# Patient Record
Sex: Female | Born: 1950 | Race: White | Hispanic: No | Marital: Single | Smoking: Current every day smoker
Health system: Southern US, Community
[De-identification: ages and names within clinical notes are randomized; demographics above are authoritative.]

## PROBLEM LIST (undated history)

## (undated) DIAGNOSIS — F1010 Alcohol abuse, uncomplicated: Secondary | ICD-10-CM

## (undated) DIAGNOSIS — J449 Chronic obstructive pulmonary disease, unspecified: Secondary | ICD-10-CM

## (undated) HISTORY — PX: KNEE SURGERY: SHX244

## (undated) HISTORY — PX: APPENDECTOMY: SHX54

---

## 2016-09-06 ENCOUNTER — Encounter: Payer: Self-pay | Admitting: Emergency Medicine

## 2016-09-06 ENCOUNTER — Emergency Department
Admission: EM | Admit: 2016-09-06 | Discharge: 2016-09-06 | Disposition: A | Discharge status: Home / Self Care | Payer: Self-pay | Source: Home / Self Care | Attending: Family Medicine | Admitting: Family Medicine

## 2016-09-06 DIAGNOSIS — K047 Periapical abscess without sinus: Secondary | ICD-10-CM

## 2016-09-06 DIAGNOSIS — K029 Dental caries, unspecified: Secondary | ICD-10-CM

## 2016-09-06 MED ORDER — HYDROCODONE-ACETAMINOPHEN 5-325 MG PO TABS: 1.0000 | ORAL_TABLET | Freq: Four times a day (QID) | ORAL | 0 refills | Status: DC | PRN

## 2016-09-06 MED ORDER — CLINDAMYCIN HCL 300 MG PO CAPS: ORAL_CAPSULE | ORAL | 0 refills | Status: DC

## 2016-09-06 NOTE — ED Provider Notes (Signed)
[facility]    CSN: [id number] Arrival date & time: 09/06/16  1248     History   Chief Complaint Chief Complaint  Patient presents with  . Dental Pain    HPI Marie Sloan is a 66 y.o. female.   Patient has had a cracked tooth in her left upper jaw for several weeks.  During the past two days she has developed increasing pain in the tooth and swelling/pain in her left cheek.  No fevers, chills, and sweats.  She has a dental appointment in 18 days.     The history is provided by the patient.  Dental Pain  Location:  Upper Upper teeth location:  12/LU 1st bicuspid Quality:  Aching and constant Severity:  Moderate Onset quality:  Sudden Duration:  2 days Timing:  Constant Progression:  Worsening Chronicity:  New Context: dental caries, dental fracture and poor dentition   Relieved by:  Nothing Worsened by:  Touching Ineffective treatments:  None tried Associated symptoms: facial pain, facial swelling, gum swelling and neck swelling   Associated symptoms: no congestion, no difficulty swallowing, no drooling, no fever, no headaches, no neck pain, no oral bleeding, no oral lesions and no trismus   Risk factors: lack of dental care     History reviewed. No pertinent past medical history.  There are no active problems to display for this patient.   Past Surgical History:  Procedure Laterality Date  . APPENDECTOMY    . KNEE SURGERY      OB History    No data available       Home Medications    Prior to Admission medications   Medication Sig Start Date End Date Taking? Authorizing Provider  clindamycin (CLEOCIN) 300 MG capsule Take one cap PO every 8 hours 09/06/16   Lattie HawBeese, Stephen A, MD  HYDROcodone-acetaminophen (NORCO/VICODIN) 5-325 MG tablet Take 1 tablet by mouth every 6 (six) hours as needed for severe pain. 09/06/16   Lattie HawBeese, Stephen A, MD    Family History History reviewed. No pertinent family history.  Social History Social History    Substance Use Topics  . Smoking status: Current Every Day Smoker    Packs/day: 1.00  . Smokeless tobacco: Never Used  . Alcohol use Yes     Allergies   Patient has no known allergies.   Review of Systems Review of Systems  Constitutional: Negative for chills, diaphoresis and fever.  HENT: Positive for facial swelling. Negative for congestion, drooling and mouth sores.   Musculoskeletal: Negative for neck pain.  Neurological: Negative for headaches.  All other systems reviewed and are negative.    Physical Exam Triage Vital Signs ED Triage Vitals  Enc Vitals Group     BP 09/06/16 1318 (!) 159/76     Pulse Rate 09/06/16 1318 89     Resp 09/06/16 1318 16     Temp 09/06/16 1318 98.8 F (37.1 C)     Temp Source 09/06/16 1318 Oral     SpO2 09/06/16 1318 96 %     Weight 09/06/16 1319 107 lb (48.5 kg)     Height 09/06/16 1319 5\' 9"  (1.753 m)     Head Circumference --      Peak Flow --      Pain Score 09/06/16 1319 4     Pain Loc --      Pain Edu? --      Excl. in GC? --    No data found.   Updated Vital Signs  BP (!) 159/76 (BP Location: Left Arm)   Pulse 89   Temp 98.8 F (37.1 C) (Oral)   Resp 16   Ht 5\' 9"  (1.753 m)   Wt 107 lb (48.5 kg)   SpO2 96%   BMI 15.80 kg/m   Visual Acuity Right Eye Distance:   Left Eye Distance:   Bilateral Distance:    Right Eye Near:   Left Eye Near:    Bilateral Near:     Physical Exam  Constitutional: She appears well-developed and well-nourished. No distress.  HENT:  Head: Normocephalic.    Right Ear: External ear normal.  Left Ear: External ear normal.  Nose: Nose normal.  Mouth/Throat: Oropharynx is clear and moist and mucous membranes are normal. No trismus in the jaw. Abnormal dentition. Dental caries present.    Left 12th tooth in left upper jaw is eroded and tender to palpation.  There is tenderness of the adjacent gingiva.  No fluctuance.  There is mild swelling and tenderness to palpation left cheek as  noted on diagram.   Eyes: Conjunctivae are normal. Pupils are equal, round, and reactive to light.  Neck: Neck supple.  Cardiovascular: Normal rate.   Lymphadenopathy:    She has no cervical adenopathy.  Neurological: She is alert.  Skin: Skin is warm and dry.  Nursing note and vitals reviewed.    UC Treatments / Results  Labs (all labs ordered are listed, but only abnormal results are displayed) Labs Reviewed - No data to display  EKG  EKG Interpretation None       Radiology No results found.  Procedures Procedures (including critical care time)  Medications Ordered in UC Medications - No data to display   Initial Impression / Assessment and Plan / UC Course  I have reviewed the triage vital signs and the nursing notes.  Pertinent labs & imaging results that were available during my care of the patient were reviewed by me and considered in my medical decision making (see chart for details).    Begin Clindamycin 300mg  TID #30 Lortab for pain. May take Ibuprofen 200mg , 4 tabs every 8 hours with food for pain. Followup with dentist as soon as possible.     Final Clinical Impressions(s) / UC Diagnoses   Final diagnoses:  Dental caries  Dental infection    New Prescriptions New Prescriptions   CLINDAMYCIN (CLEOCIN) 300 MG CAPSULE    Take one cap PO every 8 hours   HYDROCODONE-ACETAMINOPHEN (NORCO/VICODIN) 5-325 MG TABLET    Take 1 tablet by mouth every 6 (six) hours as needed for severe pain.     Lattie Haw, MD 09/16/16 1121

## 2016-09-06 NOTE — Discharge Instructions (Signed)
May take Ibuprofen 200mg, 4 tabs every 8 hours with food for pain. 

## 2016-09-06 NOTE — ED Triage Notes (Signed)
Patient cracked tooth in upper left area of mouth in past few weeks; there is infection there now and cannot see DDS for 18 more days when her insurance becomes valid. Has taken OTCs today.

## 2017-02-09 DIAGNOSIS — R69 Illness, unspecified: Secondary | ICD-10-CM | POA: Diagnosis not present

## 2017-03-14 ENCOUNTER — Emergency Department
Admission: EM | Admit: 2017-03-14 | Discharge: 2017-03-14 | Disposition: A | Discharge status: Home / Self Care | Payer: Medicare HMO | Source: Home / Self Care | Attending: Family Medicine | Admitting: Family Medicine

## 2017-03-14 ENCOUNTER — Emergency Department (INDEPENDENT_AMBULATORY_CARE_PROVIDER_SITE_OTHER): Payer: Medicare HMO

## 2017-03-14 ENCOUNTER — Encounter: Payer: Self-pay | Admitting: Emergency Medicine

## 2017-03-14 DIAGNOSIS — R911 Solitary pulmonary nodule: Secondary | ICD-10-CM | POA: Diagnosis not present

## 2017-03-14 DIAGNOSIS — J984 Other disorders of lung: Secondary | ICD-10-CM | POA: Diagnosis not present

## 2017-03-14 DIAGNOSIS — J181 Lobar pneumonia, unspecified organism: Secondary | ICD-10-CM | POA: Diagnosis not present

## 2017-03-14 DIAGNOSIS — R079 Chest pain, unspecified: Secondary | ICD-10-CM | POA: Diagnosis not present

## 2017-03-14 DIAGNOSIS — J9601 Acute respiratory failure with hypoxia: Secondary | ICD-10-CM | POA: Diagnosis not present

## 2017-03-14 DIAGNOSIS — E876 Hypokalemia: Secondary | ICD-10-CM | POA: Diagnosis not present

## 2017-03-14 DIAGNOSIS — R0602 Shortness of breath: Secondary | ICD-10-CM

## 2017-03-14 DIAGNOSIS — R509 Fever, unspecified: Secondary | ICD-10-CM | POA: Diagnosis not present

## 2017-03-14 DIAGNOSIS — E860 Dehydration: Secondary | ICD-10-CM | POA: Diagnosis not present

## 2017-03-14 DIAGNOSIS — R05 Cough: Secondary | ICD-10-CM | POA: Diagnosis not present

## 2017-03-14 DIAGNOSIS — J189 Pneumonia, unspecified organism: Secondary | ICD-10-CM

## 2017-03-14 DIAGNOSIS — A403 Sepsis due to Streptococcus pneumoniae: Secondary | ICD-10-CM | POA: Diagnosis not present

## 2017-03-14 DIAGNOSIS — J90 Pleural effusion, not elsewhere classified: Secondary | ICD-10-CM | POA: Diagnosis not present

## 2017-03-14 DIAGNOSIS — R918 Other nonspecific abnormal finding of lung field: Secondary | ICD-10-CM | POA: Diagnosis not present

## 2017-03-14 DIAGNOSIS — R531 Weakness: Secondary | ICD-10-CM | POA: Diagnosis not present

## 2017-03-14 LAB — POCT FASTING CBG KUC MANUAL ENTRY: POCT Glucose (KUC): 100 mg/dL — AB (ref 70–99)

## 2017-03-14 MED ORDER — METHYLPREDNISOLONE SODIUM SUCC 40 MG IJ SOLR
80.0000 mg | Freq: Once | INTRAMUSCULAR | Status: AC
  Administered 2017-03-14: 80 mg via INTRAMUSCULAR

## 2017-03-14 MED ORDER — IPRATROPIUM-ALBUTEROL 0.5-2.5 (3) MG/3ML IN SOLN
3.0000 mL | Freq: Once | RESPIRATORY_TRACT | Status: AC
  Administered 2017-03-14: 3 mL via RESPIRATORY_TRACT

## 2017-03-14 NOTE — Discharge Instructions (Signed)
°  IT IS EXTREMELY IMPORTANT THAT YOU GO DIRECTLY TO THE HOSPITAL FOR FURTHER TREATMENT OF YOUR PNEUMONIA.  THIS COULD LEAD TO DEATH IF NOT TREATED APPROPRIATELY AND PROMPTLY WITH STRONG ANTIBIOTICS.

## 2017-03-14 NOTE — ED Provider Notes (Signed)
[facility]    CSN: [id number] Arrival date & time: 03/14/17  1111     History   Chief Complaint Chief Complaint  Patient presents with  . Cough    HPI Marie Sloan is a 66 y.o. female.   HPI Marie Sloan is a 66 y.o. female presenting to UC with c/o 1 week of gradually worsening sinus congestion, facial pressure, SOB, mild to moderately productive cough, generalized weakness and fatigue.  She has tried OTC cough/cold medications but denies relief.  Denies known fever or sick contacts. Denies hx of pneumonia, asthma, or COPD. She does smoke 1ppd cigarettes. No recent travel. She recently receive her medicare and is in the process of finding a PCP.   History reviewed. No pertinent past medical history.  There are no active problems to display for this patient.   Past Surgical History:  Procedure Laterality Date  . APPENDECTOMY    . KNEE SURGERY      OB History    No data available       Home Medications    Prior to Admission medications   Not on File    Family History No family history on file.  Social History Social History   Tobacco Use  . Smoking status: Current Every Day Smoker    Packs/day: 1.00  . Smokeless tobacco: Never Used  Substance Use Topics  . Alcohol use: Yes    Comment: 12 drinks a week  . Drug use: Not on file     Allergies   Patient has no known allergies.   Review of Systems Review of Systems  Constitutional: Positive for fatigue. Negative for chills and fever.  HENT: Positive for congestion, sinus pressure and sinus pain. Negative for ear pain, sore throat, trouble swallowing and voice change.   Respiratory: Positive for cough, chest tightness and shortness of breath. Negative for wheezing.   Cardiovascular: Negative for chest pain and palpitations.  Gastrointestinal: Negative for abdominal pain, diarrhea, nausea and vomiting.  Musculoskeletal: Negative for arthralgias, back pain and myalgias.  Skin:  Negative for rash.  Neurological: Positive for headaches. Negative for dizziness and light-headedness.     Physical Exam Triage Vital Signs ED Triage Vitals [03/14/17 1134]  Enc Vitals Group     BP 125/77     Pulse Rate (!) 116     Resp      Temp (!) 97.4 F (36.3 C)     Temp Source Oral     SpO2 (!) 86 %     Weight 105 lb (47.6 kg)     Height 5\' 8"  (1.727 m)     Head Circumference      Peak Flow      Pain Score 0     Pain Loc      Pain Edu?      Excl. in GC?    No data found.  Updated Vital Signs BP 125/77   Pulse (!) 116   Temp (!) 97.4 F (36.3 C) (Oral)   Ht 5\' 8"  (1.727 m)   Wt 105 lb (47.6 kg)   SpO2 (!) 86%   BMI 15.97 kg/m   Visual Acuity Right Eye Distance:   Left Eye Distance:   Bilateral Distance:    Right Eye Near:   Left Eye Near:    Bilateral Near:     Physical Exam  Constitutional: She is oriented to person, place, and time. No distress.  Pt sitting in exam chair, appears disheveled and acutely  ill but is alert and cooperative during exam.   HENT:  Head: Normocephalic and atraumatic.  Right Ear: Tympanic membrane normal.  Left Ear: Tympanic membrane normal.  Nose: Nose normal.  Mouth/Throat: Uvula is midline, oropharynx is clear and moist and mucous membranes are normal.  Eyes: EOM are normal.  Neck: Normal range of motion. Neck supple.  Cardiovascular: Regular rhythm. Tachycardia present.  Pulmonary/Chest: Effort normal. No accessory muscle usage or stridor. No respiratory distress. She has decreased breath sounds. She has wheezes. She has rhonchi. She has rales.  Diffuse coarse breath sounds with decreased breath sounds in Left lower lung field.  Musculoskeletal: Normal range of motion.  Neurological: She is alert and oriented to person, place, and time.  Skin: Skin is warm and dry. She is not diaphoretic.  Psychiatric: She has a normal mood and affect. Her behavior is normal.  Nursing note and vitals reviewed.    UC Treatments /  Results  Labs (all labs ordered are listed, but only abnormal results are displayed) Labs Reviewed  POCT FASTING CBG [facility] MANUAL ENTRY - Abnormal; Notable for the following components:      Result Value   POCT Glucose ([facility]) 100 (*)    All other components within normal limits    EKG  EKG Interpretation None       Radiology Dg Chest 2 View  Result Date: 03/14/2017 CLINICAL DATA:  Shortness of breath. Cough. Congestion. Bilateral chest pain. EXAM: CHEST  2 VIEW COMPARISON:  None. FINDINGS: The heart size is normal. Lungs are hyperexpanded. A right upper lobe and left lower lobe pneumonia is are present. Right middle lobe airspace disease is suspected as well. The visualized soft tissues and bony thorax are unremarkable. IMPRESSION: 1. Multi lobar pneumonia with airspace disease most prominent in the right upper and left lower lobes. 2. Additional airspace disease in the right middle lobe. 3. Changes of COPD. 4. Recommend follow-up chest x-ray following therapy to assure clearing of the airspace disease. Electronically Signed   By: Marin Robertshristopher  Mattern M.D.   On: 03/14/2017 12:33    Procedures Procedures (including critical care time)  Medications Ordered in UC Medications  ipratropium-albuterol (DUONEB) 0.5-2.5 (3) MG/3ML nebulizer solution 3 mL (3 mLs Nebulization Given 03/14/17 1153)  methylPREDNISolone sodium succinate (SOLU-MEDROL) 40 mg/mL injection 80 mg (80 mg Intramuscular Given 03/14/17 1153)     Initial Impression / Assessment and Plan / UC Course  I have reviewed the triage vital signs and the nursing notes.  Pertinent labs & imaging results that were available during my care of the patient were reviewed by me and considered in my medical decision making (see chart for details).     Hx and exam c/w hypoxia likely due to multi lobar pneumonia. Strongly advise pt to go to emergency department for further evaluation and treatment, likely admission for treatment of  pneumonia and related symptoms.   Pt declined EMS transport and does not have anyone available to drive her to ED.   Pt is stable however due to low oxygen level 84-88% despite Solumedrol and breathing treatment, concern for pt driving POV.  Pt signed refusal to transport to hospital wavier understanding the risks of driving herself to the hospital.  [facility].  Pt agreeable to drive herself.    Final Clinical Impressions(s) / UC Diagnoses   Final diagnoses:  Pneumonia, unspecified organism  Shortness of breath    ED Discharge Orders  None       Controlled Substance Prescriptions [place] Controlled Substance Registry consulted? Not Applicable   Lurene Shadow, PA-C 03/14/17 1402

## 2017-03-14 NOTE — ED Triage Notes (Signed)
Patient complaining of sinus headache, SOB, cough, weak feeling, tried OTC meds, no relief.

## 2017-03-15 DIAGNOSIS — J9811 Atelectasis: Secondary | ICD-10-CM | POA: Diagnosis not present

## 2017-03-15 DIAGNOSIS — Z885 Allergy status to narcotic agent status: Secondary | ICD-10-CM | POA: Diagnosis not present

## 2017-03-15 DIAGNOSIS — J90 Pleural effusion, not elsewhere classified: Secondary | ICD-10-CM | POA: Diagnosis not present

## 2017-03-15 DIAGNOSIS — R918 Other nonspecific abnormal finding of lung field: Secondary | ICD-10-CM | POA: Diagnosis not present

## 2017-03-15 DIAGNOSIS — J9601 Acute respiratory failure with hypoxia: Secondary | ICD-10-CM | POA: Diagnosis not present

## 2017-03-15 DIAGNOSIS — E860 Dehydration: Secondary | ICD-10-CM | POA: Diagnosis not present

## 2017-03-15 DIAGNOSIS — R0602 Shortness of breath: Secondary | ICD-10-CM | POA: Diagnosis not present

## 2017-03-15 DIAGNOSIS — A403 Sepsis due to Streptococcus pneumoniae: Secondary | ICD-10-CM | POA: Diagnosis not present

## 2017-03-15 DIAGNOSIS — I083 Combined rheumatic disorders of mitral, aortic and tricuspid valves: Secondary | ICD-10-CM | POA: Diagnosis not present

## 2017-03-15 DIAGNOSIS — E876 Hypokalemia: Secondary | ICD-10-CM | POA: Diagnosis not present

## 2017-03-15 DIAGNOSIS — Z720 Tobacco use: Secondary | ICD-10-CM | POA: Diagnosis not present

## 2017-03-15 DIAGNOSIS — R911 Solitary pulmonary nodule: Secondary | ICD-10-CM | POA: Diagnosis not present

## 2017-03-15 DIAGNOSIS — A4189 Other specified sepsis: Secondary | ICD-10-CM | POA: Diagnosis not present

## 2017-03-15 DIAGNOSIS — A419 Sepsis, unspecified organism: Secondary | ICD-10-CM | POA: Diagnosis not present

## 2017-03-15 DIAGNOSIS — R410 Disorientation, unspecified: Secondary | ICD-10-CM | POA: Diagnosis not present

## 2017-03-15 DIAGNOSIS — Z789 Other specified health status: Secondary | ICD-10-CM | POA: Diagnosis not present

## 2017-03-15 DIAGNOSIS — J479 Bronchiectasis, uncomplicated: Secondary | ICD-10-CM | POA: Diagnosis not present

## 2017-03-15 DIAGNOSIS — J440 Chronic obstructive pulmonary disease with acute lower respiratory infection: Secondary | ICD-10-CM | POA: Diagnosis not present

## 2017-03-15 DIAGNOSIS — R69 Illness, unspecified: Secondary | ICD-10-CM | POA: Diagnosis not present

## 2017-03-15 DIAGNOSIS — E871 Hypo-osmolality and hyponatremia: Secondary | ICD-10-CM | POA: Diagnosis not present

## 2017-03-15 DIAGNOSIS — R5383 Other fatigue: Secondary | ICD-10-CM | POA: Diagnosis not present

## 2017-03-15 DIAGNOSIS — R7881 Bacteremia: Secondary | ICD-10-CM | POA: Diagnosis not present

## 2017-03-15 DIAGNOSIS — R531 Weakness: Secondary | ICD-10-CM | POA: Diagnosis not present

## 2017-03-15 DIAGNOSIS — J189 Pneumonia, unspecified organism: Secondary | ICD-10-CM | POA: Diagnosis not present

## 2017-03-15 DIAGNOSIS — J13 Pneumonia due to Streptococcus pneumoniae: Secondary | ICD-10-CM | POA: Diagnosis not present

## 2017-03-15 DIAGNOSIS — J984 Other disorders of lung: Secondary | ICD-10-CM | POA: Diagnosis not present

## 2017-03-15 DIAGNOSIS — D72825 Bandemia: Secondary | ICD-10-CM | POA: Diagnosis not present

## 2017-03-15 DIAGNOSIS — R509 Fever, unspecified: Secondary | ICD-10-CM | POA: Diagnosis not present

## 2017-03-16 DIAGNOSIS — J189 Pneumonia, unspecified organism: Secondary | ICD-10-CM | POA: Diagnosis not present

## 2017-03-16 DIAGNOSIS — J479 Bronchiectasis, uncomplicated: Secondary | ICD-10-CM | POA: Diagnosis not present

## 2017-03-16 DIAGNOSIS — J90 Pleural effusion, not elsewhere classified: Secondary | ICD-10-CM | POA: Diagnosis not present

## 2017-03-16 DIAGNOSIS — E876 Hypokalemia: Secondary | ICD-10-CM | POA: Diagnosis not present

## 2017-03-16 DIAGNOSIS — E871 Hypo-osmolality and hyponatremia: Secondary | ICD-10-CM | POA: Diagnosis not present

## 2017-03-16 DIAGNOSIS — A403 Sepsis due to Streptococcus pneumoniae: Secondary | ICD-10-CM | POA: Diagnosis not present

## 2017-03-16 DIAGNOSIS — R69 Illness, unspecified: Secondary | ICD-10-CM | POA: Diagnosis not present

## 2017-03-16 DIAGNOSIS — R7881 Bacteremia: Secondary | ICD-10-CM | POA: Diagnosis not present

## 2017-03-16 DIAGNOSIS — J9601 Acute respiratory failure with hypoxia: Secondary | ICD-10-CM | POA: Diagnosis not present

## 2017-03-16 DIAGNOSIS — A419 Sepsis, unspecified organism: Secondary | ICD-10-CM | POA: Diagnosis not present

## 2017-03-16 DIAGNOSIS — Z789 Other specified health status: Secondary | ICD-10-CM | POA: Diagnosis not present

## 2017-03-16 DIAGNOSIS — J9811 Atelectasis: Secondary | ICD-10-CM | POA: Diagnosis not present

## 2017-03-16 DIAGNOSIS — J13 Pneumonia due to Streptococcus pneumoniae: Secondary | ICD-10-CM | POA: Diagnosis not present

## 2017-03-17 DIAGNOSIS — J9601 Acute respiratory failure with hypoxia: Secondary | ICD-10-CM | POA: Diagnosis not present

## 2017-03-17 DIAGNOSIS — E871 Hypo-osmolality and hyponatremia: Secondary | ICD-10-CM | POA: Diagnosis not present

## 2017-03-17 DIAGNOSIS — A419 Sepsis, unspecified organism: Secondary | ICD-10-CM | POA: Diagnosis not present

## 2017-03-17 DIAGNOSIS — J13 Pneumonia due to Streptococcus pneumoniae: Secondary | ICD-10-CM | POA: Diagnosis not present

## 2017-03-17 DIAGNOSIS — R7881 Bacteremia: Secondary | ICD-10-CM | POA: Diagnosis not present

## 2017-03-17 DIAGNOSIS — E876 Hypokalemia: Secondary | ICD-10-CM | POA: Diagnosis not present

## 2017-03-17 DIAGNOSIS — Z789 Other specified health status: Secondary | ICD-10-CM | POA: Diagnosis not present

## 2017-03-18 DIAGNOSIS — E871 Hypo-osmolality and hyponatremia: Secondary | ICD-10-CM | POA: Diagnosis not present

## 2017-03-18 DIAGNOSIS — A419 Sepsis, unspecified organism: Secondary | ICD-10-CM | POA: Diagnosis not present

## 2017-03-18 DIAGNOSIS — R7881 Bacteremia: Secondary | ICD-10-CM | POA: Diagnosis not present

## 2017-03-18 DIAGNOSIS — E876 Hypokalemia: Secondary | ICD-10-CM | POA: Diagnosis not present

## 2017-03-18 DIAGNOSIS — Z789 Other specified health status: Secondary | ICD-10-CM | POA: Diagnosis not present

## 2017-03-18 DIAGNOSIS — J13 Pneumonia due to Streptococcus pneumoniae: Secondary | ICD-10-CM | POA: Diagnosis not present

## 2017-03-18 DIAGNOSIS — J9601 Acute respiratory failure with hypoxia: Secondary | ICD-10-CM | POA: Diagnosis not present

## 2017-03-19 DIAGNOSIS — J13 Pneumonia due to Streptococcus pneumoniae: Secondary | ICD-10-CM | POA: Diagnosis not present

## 2017-03-19 DIAGNOSIS — J9601 Acute respiratory failure with hypoxia: Secondary | ICD-10-CM | POA: Diagnosis not present

## 2017-03-19 DIAGNOSIS — Z789 Other specified health status: Secondary | ICD-10-CM | POA: Diagnosis not present

## 2017-03-19 DIAGNOSIS — E871 Hypo-osmolality and hyponatremia: Secondary | ICD-10-CM | POA: Diagnosis not present

## 2017-03-19 DIAGNOSIS — A419 Sepsis, unspecified organism: Secondary | ICD-10-CM | POA: Diagnosis not present

## 2017-03-19 DIAGNOSIS — A403 Sepsis due to Streptococcus pneumoniae: Secondary | ICD-10-CM | POA: Diagnosis not present

## 2017-03-19 DIAGNOSIS — E876 Hypokalemia: Secondary | ICD-10-CM | POA: Diagnosis not present

## 2017-03-19 DIAGNOSIS — R7881 Bacteremia: Secondary | ICD-10-CM | POA: Diagnosis not present

## 2017-03-19 DIAGNOSIS — R69 Illness, unspecified: Secondary | ICD-10-CM | POA: Diagnosis not present

## 2017-03-20 DIAGNOSIS — E871 Hypo-osmolality and hyponatremia: Secondary | ICD-10-CM | POA: Diagnosis not present

## 2017-03-20 DIAGNOSIS — R7881 Bacteremia: Secondary | ICD-10-CM | POA: Diagnosis not present

## 2017-03-20 DIAGNOSIS — Z789 Other specified health status: Secondary | ICD-10-CM | POA: Diagnosis not present

## 2017-03-20 DIAGNOSIS — J9601 Acute respiratory failure with hypoxia: Secondary | ICD-10-CM | POA: Diagnosis not present

## 2017-03-20 DIAGNOSIS — E876 Hypokalemia: Secondary | ICD-10-CM | POA: Diagnosis not present

## 2017-03-20 DIAGNOSIS — A419 Sepsis, unspecified organism: Secondary | ICD-10-CM | POA: Diagnosis not present

## 2017-03-20 DIAGNOSIS — I083 Combined rheumatic disorders of mitral, aortic and tricuspid valves: Secondary | ICD-10-CM | POA: Diagnosis not present

## 2017-03-20 DIAGNOSIS — R69 Illness, unspecified: Secondary | ICD-10-CM | POA: Diagnosis not present

## 2017-03-20 DIAGNOSIS — J13 Pneumonia due to Streptococcus pneumoniae: Secondary | ICD-10-CM | POA: Diagnosis not present

## 2017-03-24 DIAGNOSIS — R69 Illness, unspecified: Secondary | ICD-10-CM | POA: Diagnosis not present

## 2017-03-24 DIAGNOSIS — R600 Localized edema: Secondary | ICD-10-CM | POA: Diagnosis not present

## 2017-03-24 DIAGNOSIS — Z09 Encounter for follow-up examination after completed treatment for conditions other than malignant neoplasm: Secondary | ICD-10-CM | POA: Diagnosis not present

## 2017-03-24 DIAGNOSIS — J189 Pneumonia, unspecified organism: Secondary | ICD-10-CM | POA: Diagnosis not present

## 2017-04-14 DIAGNOSIS — Z23 Encounter for immunization: Secondary | ICD-10-CM | POA: Diagnosis not present

## 2017-04-14 DIAGNOSIS — A403 Sepsis due to Streptococcus pneumoniae: Secondary | ICD-10-CM | POA: Diagnosis not present

## 2017-04-14 DIAGNOSIS — B964 Proteus (mirabilis) (morganii) as the cause of diseases classified elsewhere: Secondary | ICD-10-CM | POA: Diagnosis not present

## 2017-04-14 DIAGNOSIS — J13 Pneumonia due to Streptococcus pneumoniae: Secondary | ICD-10-CM | POA: Diagnosis not present

## 2017-04-14 DIAGNOSIS — R69 Illness, unspecified: Secondary | ICD-10-CM | POA: Diagnosis not present

## 2017-04-19 DIAGNOSIS — J13 Pneumonia due to Streptococcus pneumoniae: Secondary | ICD-10-CM | POA: Diagnosis not present

## 2017-05-04 DIAGNOSIS — J13 Pneumonia due to Streptococcus pneumoniae: Secondary | ICD-10-CM | POA: Diagnosis not present

## 2017-05-04 DIAGNOSIS — Z720 Tobacco use: Secondary | ICD-10-CM | POA: Diagnosis not present

## 2017-05-04 DIAGNOSIS — J90 Pleural effusion, not elsewhere classified: Secondary | ICD-10-CM | POA: Diagnosis not present

## 2017-05-04 DIAGNOSIS — J479 Bronchiectasis, uncomplicated: Secondary | ICD-10-CM | POA: Diagnosis not present

## 2017-05-04 DIAGNOSIS — J189 Pneumonia, unspecified organism: Secondary | ICD-10-CM | POA: Diagnosis not present

## 2017-05-04 DIAGNOSIS — A403 Sepsis due to Streptococcus pneumoniae: Secondary | ICD-10-CM | POA: Diagnosis not present

## 2017-05-04 DIAGNOSIS — J449 Chronic obstructive pulmonary disease, unspecified: Secondary | ICD-10-CM | POA: Diagnosis not present

## 2017-05-04 DIAGNOSIS — B964 Proteus (mirabilis) (morganii) as the cause of diseases classified elsewhere: Secondary | ICD-10-CM | POA: Diagnosis not present

## 2017-05-07 DIAGNOSIS — R69 Illness, unspecified: Secondary | ICD-10-CM | POA: Diagnosis not present

## 2017-05-11 DIAGNOSIS — J13 Pneumonia due to Streptococcus pneumoniae: Secondary | ICD-10-CM | POA: Diagnosis not present

## 2017-05-11 DIAGNOSIS — J449 Chronic obstructive pulmonary disease, unspecified: Secondary | ICD-10-CM | POA: Diagnosis not present

## 2017-05-11 DIAGNOSIS — R030 Elevated blood-pressure reading, without diagnosis of hypertension: Secondary | ICD-10-CM | POA: Diagnosis not present

## 2017-05-11 DIAGNOSIS — L989 Disorder of the skin and subcutaneous tissue, unspecified: Secondary | ICD-10-CM | POA: Diagnosis not present

## 2017-06-08 DIAGNOSIS — Z0100 Encounter for examination of eyes and vision without abnormal findings: Secondary | ICD-10-CM | POA: Diagnosis not present

## 2017-07-06 DIAGNOSIS — Z0000 Encounter for general adult medical examination without abnormal findings: Secondary | ICD-10-CM | POA: Diagnosis not present

## 2017-07-06 DIAGNOSIS — Z23 Encounter for immunization: Secondary | ICD-10-CM | POA: Diagnosis not present

## 2017-07-06 DIAGNOSIS — Z789 Other specified health status: Secondary | ICD-10-CM | POA: Diagnosis not present

## 2017-07-06 DIAGNOSIS — R69 Illness, unspecified: Secondary | ICD-10-CM | POA: Diagnosis not present

## 2017-07-13 DIAGNOSIS — J984 Other disorders of lung: Secondary | ICD-10-CM | POA: Diagnosis not present

## 2017-07-13 DIAGNOSIS — J189 Pneumonia, unspecified organism: Secondary | ICD-10-CM | POA: Diagnosis not present

## 2017-08-03 DIAGNOSIS — J189 Pneumonia, unspecified organism: Secondary | ICD-10-CM | POA: Diagnosis not present

## 2017-08-03 DIAGNOSIS — R918 Other nonspecific abnormal finding of lung field: Secondary | ICD-10-CM | POA: Diagnosis not present

## 2017-08-03 DIAGNOSIS — J479 Bronchiectasis, uncomplicated: Secondary | ICD-10-CM | POA: Diagnosis not present

## 2019-04-14 IMAGING — DX DG CHEST 2V
2 series · 2 of 2 positions shown · non-contrast
Comparison: None.

CLINICAL DATA: Shortness of breath. Cough. Congestion. Bilateral
chest pain.

EXAM:
CHEST  2 VIEW

[chest pa]
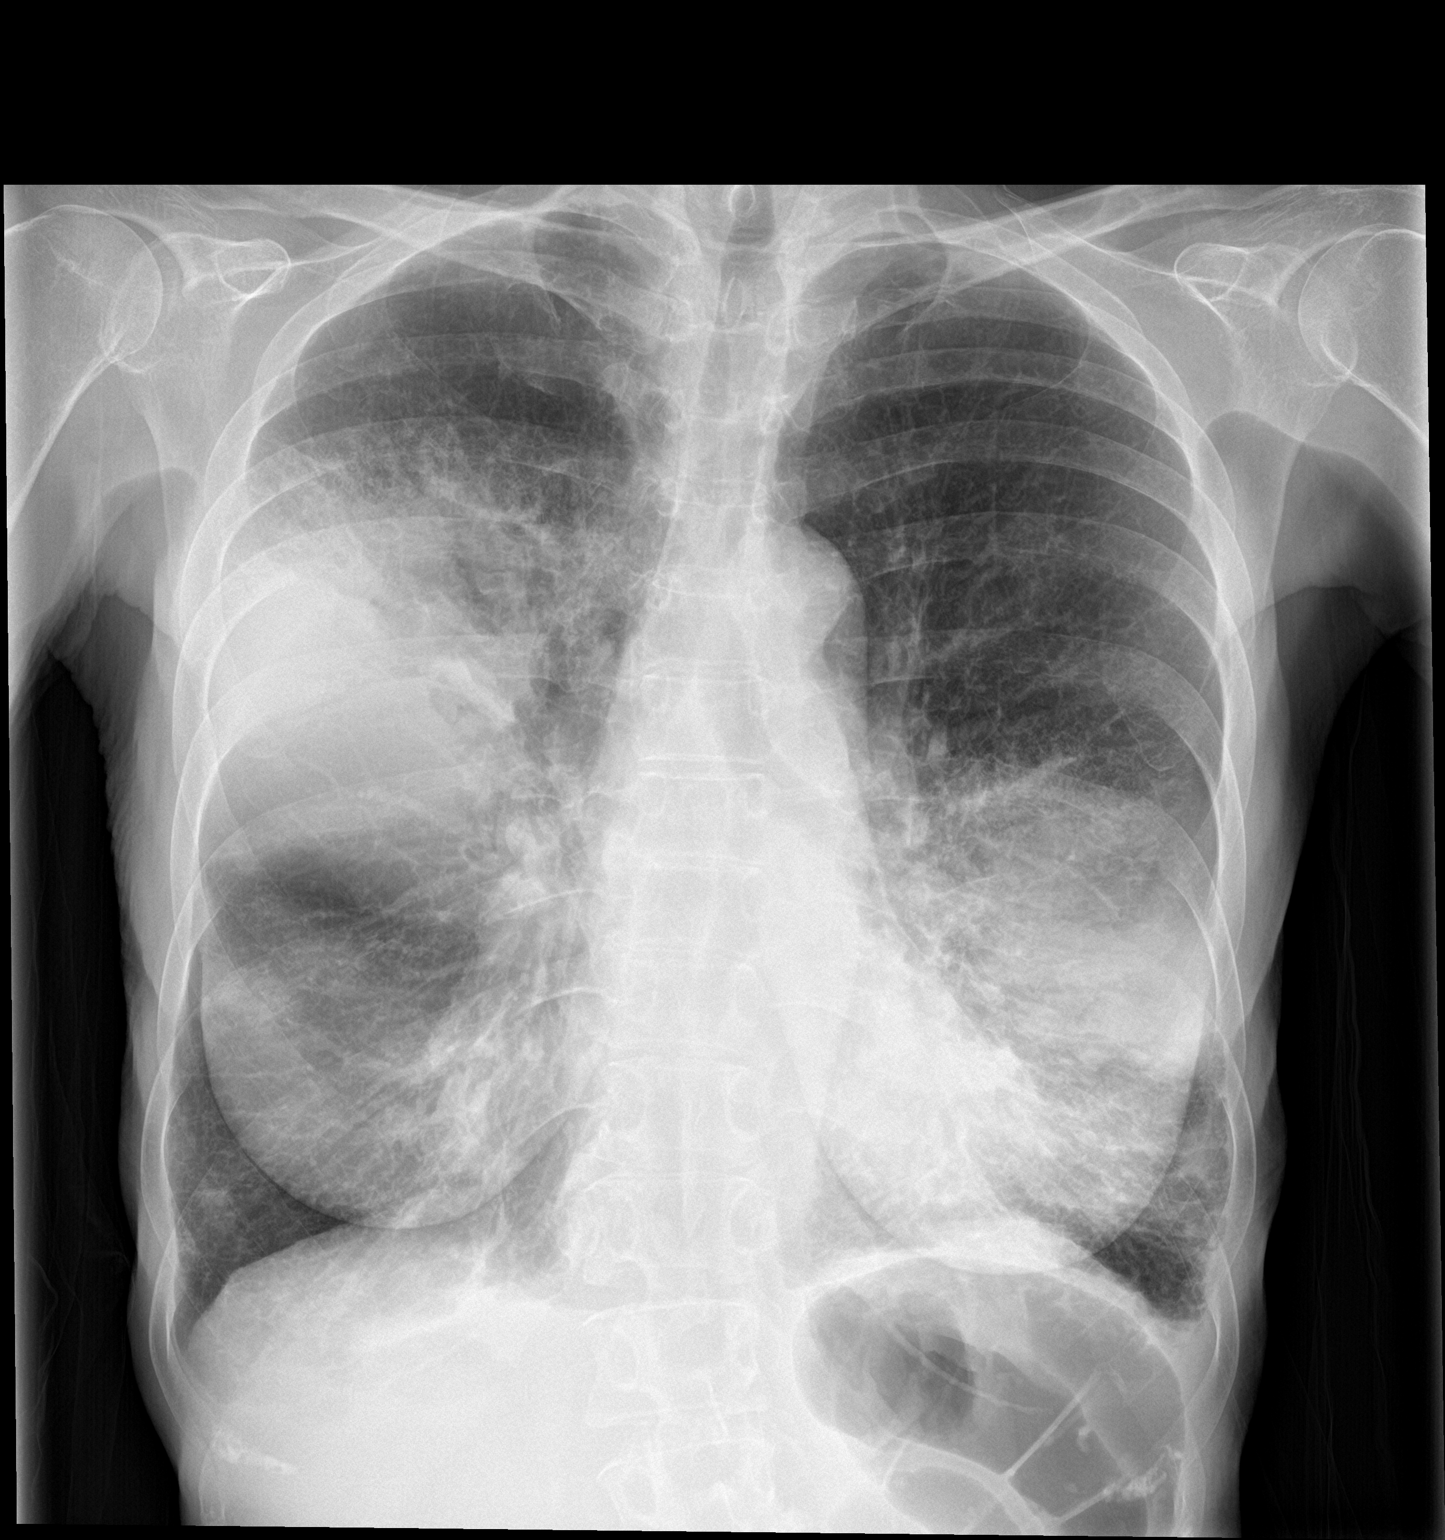

[chest lat]
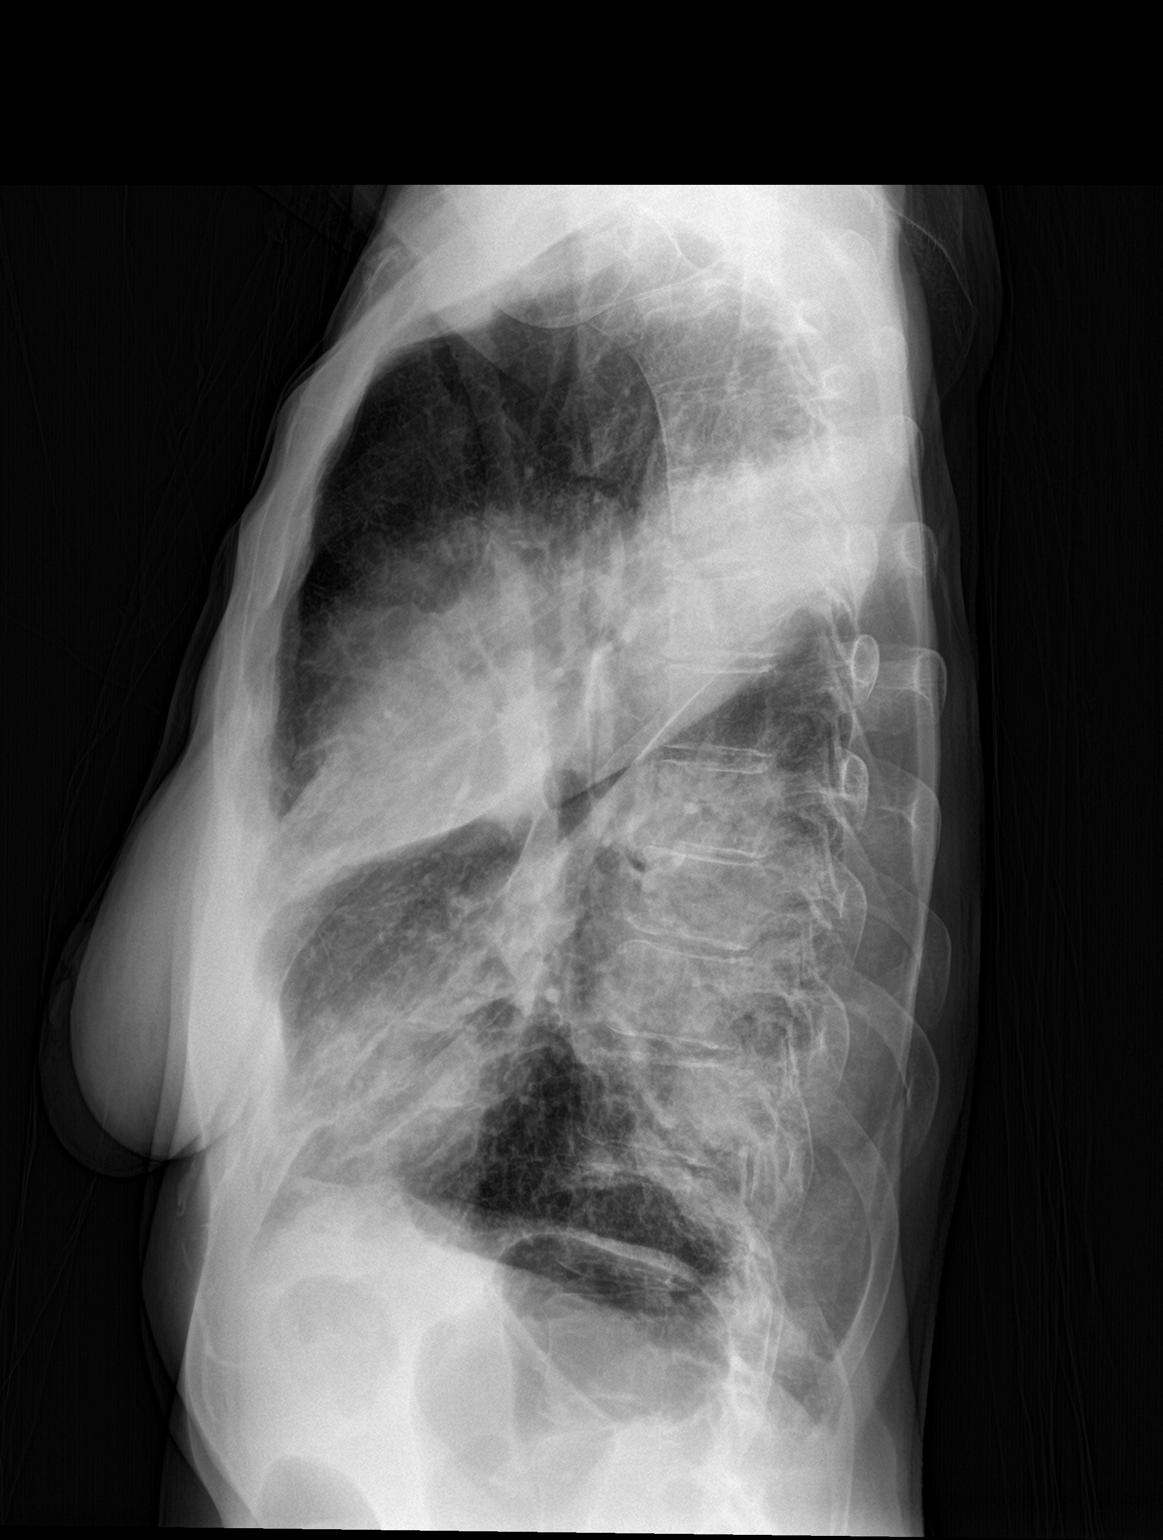

[2 of 2 positions shown; findings below may reference images not displayed]

FINDINGS: The heart size is normal. Lungs are hyperexpanded. A right upper
lobe and left lower lobe pneumonia is are present. Right middle lobe
airspace disease is suspected as well. The visualized soft tissues
and bony thorax are unremarkable.
IMPRESSION: 1. Multi lobar pneumonia with airspace disease most prominent in the
right upper and left lower lobes.
2. Additional airspace disease in the right middle lobe.
3. Changes of COPD.
4. Recommend follow-up chest x-ray following therapy to assure
clearing of the airspace disease.

## 2022-07-01 ENCOUNTER — Inpatient Hospital Stay (HOSPITAL_COMMUNITY)
Admission: EM | Admit: 2022-07-01 | Discharge: 2022-07-09 | DRG: 871 | Disposition: A | Discharge status: Home Health | Payer: Medicare HMO | Attending: Internal Medicine | Admitting: Internal Medicine

## 2022-07-01 ENCOUNTER — Other Ambulatory Visit: Payer: Self-pay

## 2022-07-01 ENCOUNTER — Encounter (HOSPITAL_COMMUNITY): Payer: Self-pay | Admitting: Emergency Medicine

## 2022-07-01 ENCOUNTER — Emergency Department (HOSPITAL_COMMUNITY): Payer: Medicare HMO

## 2022-07-01 DIAGNOSIS — I5033 Acute on chronic diastolic (congestive) heart failure: Secondary | ICD-10-CM | POA: Diagnosis present

## 2022-07-01 DIAGNOSIS — R6521 Severe sepsis with septic shock: Secondary | ICD-10-CM | POA: Diagnosis present

## 2022-07-01 DIAGNOSIS — R64 Cachexia: Secondary | ICD-10-CM | POA: Diagnosis present

## 2022-07-01 DIAGNOSIS — E876 Hypokalemia: Secondary | ICD-10-CM | POA: Diagnosis not present

## 2022-07-01 DIAGNOSIS — I110 Hypertensive heart disease with heart failure: Secondary | ICD-10-CM | POA: Diagnosis present

## 2022-07-01 DIAGNOSIS — J479 Bronchiectasis, uncomplicated: Secondary | ICD-10-CM | POA: Diagnosis present

## 2022-07-01 DIAGNOSIS — J9602 Acute respiratory failure with hypercapnia: Secondary | ICD-10-CM | POA: Diagnosis present

## 2022-07-01 DIAGNOSIS — F17210 Nicotine dependence, cigarettes, uncomplicated: Secondary | ICD-10-CM | POA: Diagnosis present

## 2022-07-01 DIAGNOSIS — E43 Unspecified severe protein-calorie malnutrition: Secondary | ICD-10-CM | POA: Diagnosis present

## 2022-07-01 DIAGNOSIS — J189 Pneumonia, unspecified organism: Principal | ICD-10-CM

## 2022-07-01 DIAGNOSIS — Y92239 Unspecified place in hospital as the place of occurrence of the external cause: Secondary | ICD-10-CM | POA: Diagnosis not present

## 2022-07-01 DIAGNOSIS — T502X5A Adverse effect of carbonic-anhydrase inhibitors, benzothiadiazides and other diuretics, initial encounter: Secondary | ICD-10-CM | POA: Diagnosis not present

## 2022-07-01 DIAGNOSIS — Z681 Body mass index (BMI) 19 or less, adult: Secondary | ICD-10-CM | POA: Diagnosis not present

## 2022-07-01 DIAGNOSIS — E874 Mixed disorder of acid-base balance: Secondary | ICD-10-CM | POA: Diagnosis present

## 2022-07-01 DIAGNOSIS — L89322 Pressure ulcer of left buttock, stage 2: Secondary | ICD-10-CM | POA: Diagnosis present

## 2022-07-01 DIAGNOSIS — E872 Acidosis, unspecified: Secondary | ICD-10-CM

## 2022-07-01 DIAGNOSIS — F1090 Alcohol use, unspecified, uncomplicated: Secondary | ICD-10-CM | POA: Diagnosis present

## 2022-07-01 DIAGNOSIS — G9340 Encephalopathy, unspecified: Secondary | ICD-10-CM

## 2022-07-01 DIAGNOSIS — R339 Retention of urine, unspecified: Secondary | ICD-10-CM | POA: Diagnosis present

## 2022-07-01 DIAGNOSIS — D487 Neoplasm of uncertain behavior of other specified sites: Secondary | ICD-10-CM | POA: Diagnosis present

## 2022-07-01 DIAGNOSIS — D649 Anemia, unspecified: Secondary | ICD-10-CM | POA: Diagnosis present

## 2022-07-01 DIAGNOSIS — J440 Chronic obstructive pulmonary disease with acute lower respiratory infection: Secondary | ICD-10-CM | POA: Diagnosis present

## 2022-07-01 DIAGNOSIS — L8990 Pressure ulcer of unspecified site, unspecified stage: Secondary | ICD-10-CM | POA: Insufficient documentation

## 2022-07-01 DIAGNOSIS — D489 Neoplasm of uncertain behavior, unspecified: Secondary | ICD-10-CM | POA: Diagnosis present

## 2022-07-01 DIAGNOSIS — A419 Sepsis, unspecified organism: Secondary | ICD-10-CM | POA: Diagnosis present

## 2022-07-01 DIAGNOSIS — G894 Chronic pain syndrome: Secondary | ICD-10-CM | POA: Diagnosis present

## 2022-07-01 DIAGNOSIS — G9341 Metabolic encephalopathy: Secondary | ICD-10-CM | POA: Diagnosis present

## 2022-07-01 DIAGNOSIS — J9601 Acute respiratory failure with hypoxia: Secondary | ICD-10-CM | POA: Diagnosis present

## 2022-07-01 DIAGNOSIS — Z1152 Encounter for screening for COVID-19: Secondary | ICD-10-CM

## 2022-07-01 DIAGNOSIS — I272 Pulmonary hypertension, unspecified: Secondary | ICD-10-CM | POA: Diagnosis present

## 2022-07-01 DIAGNOSIS — R911 Solitary pulmonary nodule: Secondary | ICD-10-CM

## 2022-07-01 DIAGNOSIS — R0602 Shortness of breath: Secondary | ICD-10-CM | POA: Diagnosis not present

## 2022-07-01 DIAGNOSIS — N179 Acute kidney failure, unspecified: Secondary | ICD-10-CM | POA: Diagnosis present

## 2022-07-01 DIAGNOSIS — R54 Age-related physical debility: Secondary | ICD-10-CM | POA: Diagnosis present

## 2022-07-01 DIAGNOSIS — Z880 Allergy status to penicillin: Secondary | ICD-10-CM

## 2022-07-01 DIAGNOSIS — J439 Emphysema, unspecified: Secondary | ICD-10-CM | POA: Diagnosis present

## 2022-07-01 DIAGNOSIS — R1900 Intra-abdominal and pelvic swelling, mass and lump, unspecified site: Secondary | ICD-10-CM | POA: Diagnosis present

## 2022-07-01 DIAGNOSIS — Z885 Allergy status to narcotic agent status: Secondary | ICD-10-CM

## 2022-07-01 HISTORY — DX: Chronic obstructive pulmonary disease, unspecified: J44.9

## 2022-07-01 HISTORY — DX: Alcohol abuse, uncomplicated: F10.10

## 2022-07-01 LAB — BLOOD GAS, VENOUS
Acid-base deficit: 5.7 mmol/L — ABNORMAL HIGH (ref 0.0–2.0)
Acid-base deficit: 8.2 mmol/L — ABNORMAL HIGH (ref 0.0–2.0)
Bicarbonate: 22.4 mmol/L (ref 20.0–28.0)
Bicarbonate: 19.3 mmol/L — ABNORMAL LOW (ref 20.0–28.0)
O2 Saturation: 17.6 %
O2 Saturation: 96.9 %
Patient temperature: 37
Patient temperature: 37
pCO2, Ven: 56 mmHg (ref 44–60)
pCO2, Ven: 46 mmHg (ref 44–60)
pH, Ven: 7.21 — ABNORMAL LOW (ref 7.25–7.43)
pH, Ven: 7.23 — ABNORMAL LOW (ref 7.25–7.43)
pO2, Ven: <31 mmHg — CL (ref 32–45)
pO2, Ven: 76 mmHg — ABNORMAL HIGH (ref 32–45)

## 2022-07-01 LAB — ETHANOL: Alcohol, Ethyl (B): <10 mg/dL (ref <10)

## 2022-07-01 LAB — OSMOLALITY: Osmolality: 314 mOsm/kg — ABNORMAL HIGH (ref 275–295)

## 2022-07-01 LAB — RAPID URINE DRUG SCREEN, HOSP PERFORMED
Amphetamines: NOT DETECTED
Barbiturates: NOT DETECTED
Benzodiazepines: NOT DETECTED
Cocaine: NOT DETECTED
Opiates: NOT DETECTED
Tetrahydrocannabinol: NOT DETECTED

## 2022-07-01 LAB — RESPIRATORY PANEL BY PCR

## 2022-07-01 LAB — COMPREHENSIVE METABOLIC PANEL
ALT: 16 U/L (ref 0–44)
AST: 32 U/L (ref 15–41)
Albumin: 3.2 g/dL — ABNORMAL LOW (ref 3.5–5.0)
Alkaline Phosphatase: 59 U/L (ref 38–126)
Anion gap: 11 (ref 5–15)
BUN: 42 mg/dL — ABNORMAL HIGH (ref 8–23)
CO2: 19 mmol/L — ABNORMAL LOW (ref 22–32)
Calcium: 8.2 mg/dL — ABNORMAL LOW (ref 8.9–10.3)
Chloride: 107 mmol/L (ref 98–111)
Creatinine, Ser: 1.06 mg/dL — ABNORMAL HIGH (ref 0.44–1.00)
GFR, Estimated: 57 mL/min — ABNORMAL LOW (ref >60)
Glucose, Bld: 114 mg/dL — ABNORMAL HIGH (ref 70–99)
Potassium: 4.2 mmol/L (ref 3.5–5.1)
Sodium: 137 mmol/L (ref 135–145)
Total Bilirubin: 0.7 mg/dL (ref 0.3–1.2)
Total Protein: 7.3 g/dL (ref 6.5–8.1)

## 2022-07-01 LAB — CBC WITH DIFFERENTIAL/PLATELET
Abs Immature Granulocytes: 0.18 10*3/uL — ABNORMAL HIGH (ref 0.00–0.07)
Basophils Absolute: 0 10*3/uL (ref 0.0–0.1)
Basophils Relative: 0 %
Eosinophils Absolute: 0 10*3/uL (ref 0.0–0.5)
Eosinophils Relative: 0 %
HCT: 29.9 % — ABNORMAL LOW (ref 36.0–46.0)
Hemoglobin: 9.5 g/dL — ABNORMAL LOW (ref 12.0–15.0)
Immature Granulocytes: 2 %
Lymphocytes Relative: 4 %
Lymphs Abs: 0.5 10*3/uL — ABNORMAL LOW (ref 0.7–4.0)
MCH: 29.8 pg (ref 26.0–34.0)
MCHC: 31.8 g/dL (ref 30.0–36.0)
MCV: 93.7 fL (ref 80.0–100.0)
Monocytes Absolute: 0.5 10*3/uL (ref 0.1–1.0)
Monocytes Relative: 4 %
Neutro Abs: 10.8 10*3/uL — ABNORMAL HIGH (ref 1.7–7.7)
Neutrophils Relative %: 90 %
Platelets: 487 10*3/uL — ABNORMAL HIGH (ref 150–400)
RBC: 3.19 MIL/uL — ABNORMAL LOW (ref 3.87–5.11)
RDW: 21.2 % — ABNORMAL HIGH (ref 11.5–15.5)
WBC: 12 10*3/uL — ABNORMAL HIGH (ref 4.0–10.5)
nRBC: 0 % (ref 0.0–0.2)

## 2022-07-01 LAB — URINALYSIS, ROUTINE W REFLEX MICROSCOPIC
Bilirubin Urine: NEGATIVE
Glucose, UA: NEGATIVE mg/dL
Hgb urine dipstick: NEGATIVE
Ketones, ur: 5 mg/dL — AB
Leukocytes,Ua: NEGATIVE
Nitrite: NEGATIVE
Protein, ur: 100 mg/dL — AB
Specific Gravity, Urine: 1.018 (ref 1.005–1.030)
pH: 5 (ref 5.0–8.0)

## 2022-07-01 LAB — RESP PANEL BY RT-PCR (RSV, FLU A&B, COVID)  RVPGX2
Influenza A by PCR: NEGATIVE
Influenza B by PCR: NEGATIVE
Resp Syncytial Virus by PCR: NEGATIVE
SARS Coronavirus 2 by RT PCR: NEGATIVE

## 2022-07-01 LAB — PROTIME-INR
INR: 1.4 — ABNORMAL HIGH (ref 0.8–1.2)
Prothrombin Time: 17.1 seconds — ABNORMAL HIGH (ref 11.4–15.2)

## 2022-07-01 LAB — MRSA NEXT GEN BY PCR, NASAL: MRSA by PCR Next Gen: NOT DETECTED

## 2022-07-01 LAB — MAGNESIUM: Magnesium: 2 mg/dL (ref 1.7–2.4)

## 2022-07-01 LAB — TROPONIN I (HIGH SENSITIVITY)
Troponin I (High Sensitivity): 7 ng/L (ref <18)
Troponin I (High Sensitivity): 8 ng/L (ref <18)

## 2022-07-01 LAB — AMMONIA: Ammonia: 23 umol/L (ref 9–35)

## 2022-07-01 LAB — CK: Total CK: 158 U/L (ref 38–234)

## 2022-07-01 LAB — PHOSPHORUS: Phosphorus: 4.1 mg/dL (ref 2.5–4.6)

## 2022-07-01 LAB — GLUCOSE, CAPILLARY
Glucose-Capillary: 68 mg/dL — ABNORMAL LOW (ref 70–99)
Glucose-Capillary: 83 mg/dL (ref 70–99)

## 2022-07-01 LAB — APTT: aPTT: 28 seconds (ref 24–36)

## 2022-07-01 LAB — LACTIC ACID, PLASMA
Lactic Acid, Venous: 0.6 mmol/L (ref 0.5–1.9)
Lactic Acid, Venous: 0.7 mmol/L (ref 0.5–1.9)

## 2022-07-01 LAB — OSMOLALITY, URINE: Osmolality, Ur: 432 mOsm/kg (ref 300–900)

## 2022-07-01 LAB — CBG MONITORING, ED: Glucose-Capillary: 106 mg/dL — ABNORMAL HIGH (ref 70–99)

## 2022-07-01 MED ORDER — BUDESONIDE 0.25 MG/2ML IN SUSP: 0.2500 mg | Freq: Two times a day (BID) | RESPIRATORY_TRACT | Status: DC

## 2022-07-01 MED ORDER — ALBUTEROL SULFATE (2.5 MG/3ML) 0.083% IN NEBU
2.5000 mg | INHALATION_SOLUTION | RESPIRATORY_TRACT | Status: DC | PRN
  Administered 2022-07-03 – 2022-07-06 (×6): 2.5 mg via RESPIRATORY_TRACT
  Filled 2022-07-01 (×6): qty 3

## 2022-07-01 MED ORDER — LACTATED RINGERS IV BOLUS
1000.0000 mL | Freq: Once | INTRAVENOUS | Status: AC
  Administered 2022-07-01: 1000 mL via INTRAVENOUS

## 2022-07-01 MED ORDER — MIDAZOLAM HCL 2 MG/2ML IJ SOLN
1.0000 mg | INTRAMUSCULAR | Status: DC | PRN
  Administered 2022-07-02: 2 mg via INTRAVENOUS
  Filled 2022-07-01: qty 2

## 2022-07-01 MED ORDER — REVEFENACIN 175 MCG/3ML IN SOLN
175.0000 ug | Freq: Every day | RESPIRATORY_TRACT | Status: DC
  Administered 2022-07-01 – 2022-07-09 (×7): 175 ug via RESPIRATORY_TRACT
  Filled 2022-07-01 (×9): qty 3

## 2022-07-01 MED ORDER — LACTATED RINGERS IV SOLN: INTRAVENOUS | Status: DC

## 2022-07-01 MED ORDER — SODIUM CHLORIDE 0.9 % IV SOLN
2.0000 g | INTRAVENOUS | Status: AC
  Administered 2022-07-01 – 2022-07-05 (×5): 2 g via INTRAVENOUS
  Filled 2022-07-01 (×5): qty 20

## 2022-07-01 MED ORDER — ARFORMOTEROL TARTRATE 15 MCG/2ML IN NEBU
15.0000 ug | INHALATION_SOLUTION | Freq: Two times a day (BID) | RESPIRATORY_TRACT | Status: DC
  Administered 2022-07-01 – 2022-07-04 (×7): 15 ug via RESPIRATORY_TRACT
  Filled 2022-07-01 (×7): qty 2

## 2022-07-01 MED ORDER — NALOXONE HCL 0.4 MG/ML IJ SOLN
0.4000 mg | Freq: Once | INTRAMUSCULAR | Status: AC
  Administered 2022-07-01: 0.4 mg via INTRAVENOUS
  Filled 2022-07-01: qty 1

## 2022-07-01 MED ORDER — SODIUM CHLORIDE 0.9 % IV SOLN
500.0000 mg | INTRAVENOUS | Status: AC
  Administered 2022-07-01 – 2022-07-05 (×5): 500 mg via INTRAVENOUS
  Filled 2022-07-01 (×5): qty 5

## 2022-07-01 MED ORDER — DEXTROSE 50 % IV SOLN
INTRAVENOUS | Status: AC
  Administered 2022-07-01: 25 mL
  Filled 2022-07-01: qty 50

## 2022-07-01 MED ORDER — LACTATED RINGERS IV BOLUS (SEPSIS)
500.0000 mL | Freq: Once | INTRAVENOUS | Status: AC
  Administered 2022-07-01: 500 mL via INTRAVENOUS

## 2022-07-01 MED ORDER — FOLIC ACID 5 MG/ML IJ SOLN
1.0000 mg | Freq: Every day | INTRAMUSCULAR | Status: DC
  Administered 2022-07-01 – 2022-07-06 (×6): 1 mg via INTRAVENOUS
  Filled 2022-07-01 (×7): qty 0.2

## 2022-07-01 MED ORDER — THIAMINE HCL 100 MG/ML IJ SOLN
100.0000 mg | INTRAMUSCULAR | Status: DC
  Administered 2022-07-04 – 2022-07-06 (×3): 100 mg via INTRAVENOUS
  Filled 2022-07-01 (×3): qty 2

## 2022-07-01 MED ORDER — BUDESONIDE 0.5 MG/2ML IN SUSP
0.5000 mg | Freq: Two times a day (BID) | RESPIRATORY_TRACT | Status: DC
  Administered 2022-07-01 – 2022-07-04 (×7): 0.5 mg via RESPIRATORY_TRACT
  Filled 2022-07-01 (×7): qty 2

## 2022-07-01 MED ORDER — ORAL CARE MOUTH RINSE: 15.0000 mL | OROMUCOSAL | Status: DC | PRN

## 2022-07-01 MED ORDER — THIAMINE HCL 100 MG/ML IJ SOLN
500.0000 mg | Freq: Three times a day (TID) | INTRAVENOUS | Status: AC
  Administered 2022-07-01 – 2022-07-04 (×9): 500 mg via INTRAVENOUS
  Filled 2022-07-01 (×9): qty 5

## 2022-07-01 MED ORDER — HEPARIN SODIUM (PORCINE) 5000 UNIT/ML IJ SOLN
5000.0000 [IU] | Freq: Three times a day (TID) | INTRAMUSCULAR | Status: DC
  Administered 2022-07-01 – 2022-07-09 (×24): 5000 [IU] via SUBCUTANEOUS
  Filled 2022-07-01 (×23): qty 1

## 2022-07-01 MED ORDER — HALOPERIDOL LACTATE 5 MG/ML IJ SOLN
2.0000 mg | INTRAMUSCULAR | Status: DC | PRN
  Administered 2022-07-02 (×2): 2 mg via INTRAVENOUS
  Filled 2022-07-01 (×2): qty 1

## 2022-07-01 MED ORDER — DEXMEDETOMIDINE HCL IN NACL 400 MCG/100ML IV SOLN
0.0000 ug/kg/h | INTRAVENOUS | Status: DC
  Administered 2022-07-01: 0.4 ug/kg/h via INTRAVENOUS
  Filled 2022-07-01: qty 100

## 2022-07-01 MED ORDER — CHLORHEXIDINE GLUCONATE CLOTH 2 % EX PADS
6.0000 | MEDICATED_PAD | Freq: Every day | CUTANEOUS | Status: DC
  Administered 2022-07-01 – 2022-07-09 (×8): 6 via TOPICAL

## 2022-07-01 MED ORDER — NOREPINEPHRINE 4 MG/250ML-% IV SOLN
2.0000 ug/min | INTRAVENOUS | Status: DC
  Administered 2022-07-02: 10 ug/min via INTRAVENOUS
  Administered 2022-07-02: 2 ug/min via INTRAVENOUS
  Administered 2022-07-02: 10 ug/min via INTRAVENOUS
  Filled 2022-07-01 (×3): qty 250

## 2022-07-01 MED ORDER — SODIUM CHLORIDE 0.9 % IV SOLN
250.0000 mL | INTRAVENOUS | Status: DC
  Administered 2022-07-03: 250 mL via INTRAVENOUS

## 2022-07-01 MED ORDER — IOHEXOL 350 MG/ML SOLN
75.0000 mL | Freq: Once | INTRAVENOUS | Status: AC | PRN
  Administered 2022-07-01: 57 mL via INTRAVENOUS

## 2022-07-01 MED ORDER — ALBUTEROL SULFATE (2.5 MG/3ML) 0.083% IN NEBU
5.0000 mg | INHALATION_SOLUTION | Freq: Once | RESPIRATORY_TRACT | Status: AC
  Administered 2022-07-01: 5 mg via RESPIRATORY_TRACT
  Filled 2022-07-01: qty 6

## 2022-07-01 NOTE — ED Notes (Signed)
Patient increasingly agitated. Screaming "let me go" and "stop" anytime patient is touched.

## 2022-07-01 NOTE — ED Triage Notes (Signed)
Per [facility] pt coming from airport. Staff states patient was receiving assistance to get to her seat. When flight attendant walked past noticed patient was unresponsive. Pills found in patient belongings. Patient responsive to loud stimuli or soft touch. Initial oxygen sat 86% with fire and placed on non rebreather. Fire reports patient smelling of alcohol.   400 CC NS given en route

## 2022-07-01 NOTE — Sepsis Progress Note (Signed)
Code sepsis protocol being monitored by [employer]. 

## 2022-07-01 NOTE — H&P (Signed)
See consult note by Kennieth Rad dated 07/01/22 which I attested.  [place]

## 2022-07-01 NOTE — ED Provider Notes (Signed)
[place] EMERGENCY DEPARTMENT AT [facility] Provider Note   CSN: [id number] Arrival date & time: 07/01/22  [id number]     History  Chief Complaint  Patient presents with   Altered Mental Status    Marie Sloan is a 72 y.o. female.  HPI     72 year old female comes in with chief complaint of altered mental status.  Patient arrives from the airport by EMS.  According to the EMS, patient was waiting to check into a flight to [place].  She was found by the staff they are requiring assistance and then noted to be minimally responsive, prompting them to call EMS.  Patient was found hypoxic with sats at 86%.  She is a mouth breather, therefore that she was put on a nonrebreather.  Patient responsive to noxious stimuli only.  Patient oriented to self and able to tell me her age.  She did not know she was in the hospital.  She was unable to provide me any meaningful history surrounding her being at the airport.  Home Medications Prior to Admission medications   Not on File      Allergies    Patient has no known allergies.    Review of Systems   Review of Systems  All other systems reviewed and are negative.   Physical Exam Updated Vital Signs BP 105/71   Pulse 77   Temp 97.6 F (36.4 C) (Oral)   Resp (!) 24   Ht '5\' 8"'$  (1.727 m)   Wt 47.6 kg   SpO2 92%   BMI 15.97 kg/m  Physical Exam Vitals and nursing note reviewed.  Constitutional:      Appearance: She is well-developed.     Comments: Somnolent, arousable to noxious stimuli  HENT:     Head: Atraumatic.  Eyes:     Extraocular Movements: Extraocular movements intact.     Pupils: Pupils are equal, round, and reactive to light.  Cardiovascular:     Rate and Rhythm: Normal rate.  Pulmonary:     Effort: Pulmonary effort is normal.     Breath sounds: No wheezing.     Comments: Reduced breath sounds Musculoskeletal:     Cervical back: Neck supple.  Skin:    General: Skin is warm and dry.   Neurological:     Mental Status: She is disoriented.     ED Results / Procedures / Treatments   Labs (all labs ordered are listed, but only abnormal results are displayed) Labs Reviewed  COMPREHENSIVE METABOLIC PANEL - Abnormal; Notable for the following components:      Result Value   CO2 19 (*)    Glucose, Bld 114 (*)    BUN 42 (*)    Creatinine, Ser 1.06 (*)    Calcium 8.2 (*)    Albumin 3.2 (*)    GFR, Estimated 57 (*)    All other components within normal limits  CBC WITH DIFFERENTIAL/PLATELET - Abnormal; Notable for the following components:   WBC 12.0 (*)    RBC 3.19 (*)    Hemoglobin 9.5 (*)    HCT 29.9 (*)    RDW 21.2 (*)    Platelets 487 (*)    Neutro Abs 10.8 (*)    Lymphs Abs 0.5 (*)    Abs Immature Granulocytes 0.18 (*)    All other components within normal limits  BLOOD GAS, VENOUS - Abnormal; Notable for the following components:   pH, Ven 7.21 (*)    pO2, Ven <31 (*)  Acid-base deficit 5.7 (*)    All other components within normal limits  BLOOD GAS, VENOUS - Abnormal; Notable for the following components:   pH, Ven 7.23 (*)    pO2, Ven 76 (*)    Bicarbonate 19.3 (*)    Acid-base deficit 8.2 (*)    All other components within normal limits  CBG MONITORING, ED - Abnormal; Notable for the following components:   Glucose-Capillary 106 (*)    All other components within normal limits  RESP PANEL BY RT-PCR (RSV, FLU A&B, COVID)  RVPGX2  CULTURE, BLOOD (ROUTINE X 2)  CULTURE, BLOOD (ROUTINE X 2)  URINE CULTURE  ETHANOL  RAPID URINE DRUG SCREEN, HOSP PERFORMED  LACTIC ACID, PLASMA  LACTIC ACID, PLASMA  OSMOLALITY, URINE  MAGNESIUM  PHOSPHORUS  PROTIME-INR  APTT  URINALYSIS, ROUTINE W REFLEX MICROSCOPIC  TROPONIN I (HIGH SENSITIVITY)  TROPONIN I (HIGH SENSITIVITY)    EKG EKG Interpretation  Date/Time:  Wednesday July 01 2022 09:16:48 EST Ventricular Rate:  83 PR Interval:  128 QRS Duration: 99 QT Interval:  372 QTC Calculation: 438 R  Axis:   23 Text Interpretation: Sinus rhythm Low voltage, extremity leads Nonspecific T abnrm, anterolateral leads Minimal ST elevation, inferior leads No old tracing to compare Confirmed by Varney Biles [phone]) on 07/01/2022 11:18:17 AM  Radiology CT Angio Chest PE W and/or Wo Contrast  Result Date: 07/01/2022 CLINICAL DATA:  Altered mental status, high clinical suspicion for PE EXAM: CT ANGIOGRAPHY CHEST WITH CONTRAST TECHNIQUE: Multidetector CT imaging of the chest was performed using the standard protocol during bolus administration of intravenous contrast. Multiplanar CT image reconstructions and MIPs were obtained to evaluate the vascular anatomy. RADIATION DOSE REDUCTION: This exam was performed according to the departmental dose-optimization program which includes automated exposure control, adjustment of the mA and/or kV according to patient size and/or use of iterative reconstruction technique. CONTRAST:  77m OMNIPAQUE IOHEXOL 350 MG/ML SOLN COMPARISON:  Chest radiograph done earlier today FINDINGS: Cardiovascular: There are no intraluminal filling defects in central pulmonary artery branches. Evaluation of small peripheral branches is limited by motion artifacts and infiltrates. Ascending thoracic aorta measures 3.6 cm. Scattered coronary artery calcifications are seen. Heart is enlarged in size. Mediastinum/Nodes: There are slightly enlarged lymph nodes in both hilar regions, possibly suggesting reactive hyperplasia. Lungs/Pleura: Centrilobular and panlobular emphysema is seen. There is ectasia of bronchi. There are patchy infiltrates in right middle lobe and right lower lobe. Small linear patchy infiltrate is seen in posterior left lower lung fields. In image 116 of series 6, there is 11 mm pleural-based nodule in the lateral aspect of left lower lobe. There are small patchy ground-glass infiltrates in the upper lobes. Pleural thickening is seen in both apices, more so on the right side. Possible  minimal right pleural effusion is seen. There are multiple blebs in the periphery of both upper lung fields, more so on the right side. There is no demonstrable pneumothorax. Upper Abdomen: No acute findings are seen. Musculoskeletal: No acute findings are seen. Review of the MIP images confirms the above findings. IMPRESSION: There is no evidence of central pulmonary artery embolism. There is no evidence of thoracic aortic dissection. COPD. There are patchy alveolar infiltrates in both lower lung fields, more so on the right side suggesting atelectasis/pneumonia. There are scattered ground-glass densities in both upper lung fields suggesting scarring or pneumonia. There is 11 mm noncalcified pleural-based nodule in left lower lobe. This may suggest part of pneumonia or malignant neoplasm. Short-term follow-up CT  in 1-2 months along with PET-CT if warranted should be considered. Coronary artery calcifications are seen. Electronically Signed   By: Elmer Picker M.D.   On: 07/01/2022 14:41   CT Head Wo Contrast  Result Date: 07/01/2022 CLINICAL DATA:  Mental status change, unknown cause. EXAM: CT HEAD WITHOUT CONTRAST TECHNIQUE: Contiguous axial images were obtained from the base of the skull through the vertex without intravenous contrast. RADIATION DOSE REDUCTION: This exam was performed according to the departmental dose-optimization program which includes automated exposure control, adjustment of the mA and/or kV according to patient size and/or use of iterative reconstruction technique. COMPARISON:  None Available. FINDINGS: Brain: No acute hemorrhage, mass effect or midline shift. Gray-white differentiation is preserved. No hydrocephalus. No extra-axial collection. Basilar cisterns are patent. Vascular: No hyperdense vessel or unexpected calcification. Skull: No calvarial fracture or suspicious bone lesion. Skull base is unremarkable. Sinuses/Orbits: Unremarkable. Other: None. IMPRESSION: No acute  intracranial abnormality. Electronically Signed   By: Emmit Alexanders M.D.   On: 07/01/2022 14:31   DG Chest Port 1 View  Result Date: 07/01/2022 CLINICAL DATA:  Short of breath, unresponsive, hypoxia EXAM: PORTABLE CHEST 1 VIEW COMPARISON:  03/14/2017 FINDINGS: Single frontal view of the chest demonstrates an unremarkable cardiac silhouette. Continued background emphysema and parenchymal lung scarring. Prominence of the hilar vasculature likely reflects underlying pulmonary arterial hypertension. No airspace disease, effusion, or pneumothorax. No acute bony abnormalities. IMPRESSION: 1. Emphysema.  No acute airspace disease. 2. Prominent hilar vasculature, favor pulmonary arterial hypertension given evidence of background emphysema. Electronically Signed   By: Randa Ngo M.D.   On: 07/01/2022 09:46    Procedures .Critical Care  Performed by: Varney Biles, MD Authorized by: Varney Biles, MD   Critical care provider statement:    Critical care time (minutes):  41   Critical care was necessary to treat or prevent imminent or life-threatening deterioration of the following conditions:  Circulatory failure, dehydration and CNS failure or compromise   Critical care was time spent personally by me on the following activities:  Development of treatment plan with patient or surrogate, discussions with consultants, evaluation of patient's response to treatment, examination of patient, ordering and review of laboratory studies, ordering and review of radiographic studies, ordering and performing treatments and interventions, pulse oximetry, re-evaluation of patient's condition and review of old charts     Medications Ordered in ED Medications  lactated ringers infusion (has no administration in time range)  lactated ringers bolus 500 mL (has no administration in time range)  cefTRIAXone (ROCEPHIN) 2 g in sodium chloride 0.9 % 100 mL IVPB (has no administration in time range)  azithromycin  (ZITHROMAX) 500 mg in sodium chloride 0.9 % 250 mL IVPB (has no administration in time range)  lactated ringers bolus 1,000 mL (0 mLs Intravenous Stopped 07/01/22 1243)  albuterol (PROVENTIL) (2.5 MG/3ML) 0.083% nebulizer solution 5 mg (5 mg Nebulization Given 07/01/22 1045)  naloxone (NARCAN) injection 0.4 mg (0.4 mg Intravenous Given 07/01/22 1300)  iohexol (OMNIPAQUE) 350 MG/ML injection 75 mL (57 mLs Intravenous Contrast Given 07/01/22 1420)    ED Course/ Medical Decision Making/ A&P Clinical Course as of 07/01/22 [place]  Wed Jul 01, 2022  1224 Rapid urine drug screen (hospital performed) Urine drug screen is negative. [AN]  1224 pH, Ven(!): 7.23 Patient's initial blood gas and repeat blood gas both show metabolic acidosis.  pCO2 is normal.  Patient is not hypercapnic, we have not placed on BiPAP. [AN]  1224 Ethanol Ethanol is negative. [AN]  Clinical Course User Index [AN] Varney Biles, MD                             Medical Decision Making Amount and/or Complexity of Data Reviewed Labs: ordered. Decision-making details documented in ED Course. Radiology: ordered. ECG/medicine tests: ordered.  Risk Prescription drug management. Decision regarding hospitalization.   This patient presents to the ED with chief complaint(s) of altered mental status, unresponsiveness with pertinent past medical history of alcohol use disorder, tobacco use disorder, emphysema, pelvic mass.The complaint involves an extensive differential diagnosis and also carries with it a high risk of complications and morbidity.  Patient gets her care at outside hospital.  The differential diagnosis includes : ICH / Stroke, acute coronary syndrome, Infection - UTI/Pneumonia/soft tissue infection leading to encephalopathy, encephalopathy due to electrolyte abnormality or drug interactions or toxins or metabolic conditions like adrenal insufficiency, hyperglycemia, paraneoplastic process, Hypercapnia / COPD, Hypoxia    The initial plan is to place patient on cardiac monitor.  End-tidal CO2 ordered.  Basic labs have been ordered.  X-ray has also been ordered.  Patient does not have pinpoint pupil.  There is no respiratory depression, no clear indication for Narcan.   Additional history obtained: Additional history obtained from EMS  Records reviewed Care Everywhere/External Records -which helped [place] get patient's medical history.  Independent labs interpretation:  The following labs were independently interpreted: See workup tab  Independent visualization and interpretation of imaging: - I independently visualized the following imaging with scope of interpretation limited to determining acute life threatening conditions related to emergency care: X-ray of the chest, which revealed no evidence of large pleural effusion, pneumothorax  Treatment and Reassessment: Patient reassessed on multiple occasions.  She remains somnolent.  She is still arousable to noxious stimuli.  She is still not able to provide any meaningful history.  Spoke with patient's brother.  He states that he was waiting for her at the airport in [place].  He spoke with the patient yesterday, and she appeared appropriate.   I had a CT scan of the head and CT angio chest after conversation with the brother. They are negative for any acute process.  Questionable pneumonia noted.  We will start antibiotics.  Code sepsis workup initiated once the CT angiogram shows questionable pneumonia.   I have given Narcan without any relief.  Patient was noted to have metabolic acidosis.  She is also noted to have BUN of 40 with creatinine of 1.  Hemoglobin is stable at this time.  She received 1 L of IV fluid, repeat blood gas is unchanged.  She has metabolic acidosis of unknown etiology as even lactic acid is fine.  ?  Toxic alcohols as the cause.   Final Clinical Impression(s) / ED Diagnoses Final diagnoses:  Community acquired pneumonia,  unspecified laterality  Acute encephalopathy  Metabolic acidosis    Rx / DC Orders ED Discharge Orders     None         Varney Biles, MD 07/01/22 [phone]

## 2022-07-01 NOTE — ED Notes (Signed)
Patient transported to CT 

## 2022-07-01 NOTE — Progress Notes (Signed)
Spoke with Domingo Mend (brother) about [employer]. Merry Proud states Garland doesn't have any family in [place] and he would like Industrial/product designer (Melesa's boss) to come to patient's room and get a key so that they can have access to her medication list. Patient has 2 patient belonging bags at bedside with clothing and one brown purse with a cell phone, wallet, and a single key.  Verified with Nicola Girt, RN. Charge RN aware. Tammy aware that she will be supervised by RN/staff if she comes to get key.

## 2022-07-01 NOTE — ED Notes (Addendum)
Marie Sloan, patients brother called and was provided with an update. He said he was waiting at the airport in [place] for patient.

## 2022-07-01 NOTE — Consult Note (Addendum)
NAME:  Marie Sloan, MRN:  [id number], DOB:  1950/12/14, LOS: 0 ADMISSION DATE:  07/01/2022, CONSULTATION DATE:  07/01/22 REFERRING MD:  TRH, CHIEF COMPLAINT:  AMS   History of Present Illness:   HPI obtained from EMR review given patient's ongoing encephalopathy.   72 year old female with PMH significant for tobacco abuse, ?ETOH use, HTN, and COPD presenting to [facility] with altered mental status and hypoxia from airport.  Patient was found minimally responsive by flight attendants on a flight to [place] with pills in her lap.  EMS reported room air sats 86%.  Arrived responsive to noxious stimuli in ER on NRB.     Recently evaluated in [facility] ER 05/19/22 with multiple complaints of weakness, poor PO intake, LE edema, and recent pneumonia evaluated for possible GIB given Hgb 7.7.  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  She declined further GI evaluation/ EGD, echo, and was to follow up in outpatient.   Pt afebrile, HR NSR, SBP 90-110's, and requiring 4L .  ER workup notable for CO2 19, glucose 114, BUN/ sCr 42/ 1.06, albumin 3.2, trop hs neg 7> 8, normal lactic x 2, WBC 12, H/H 9.5/ 29.9, INR 1.4, neg SARS/ flu/ RSV, ETOH neg, UDS neg, CTH negative for acute intracranial process, CTA PE was neg for PE but showed patchy lower alveolar infiltrates, R> L suggestive of atelectasis vs pneumonia, with scattered ground glass densities in upper lung fields, and 11 mm pleural based LLL nodule.  Cultures sent and started on azithromycin and ceftriaxone, with 1.5L LR bolus.  Narcan given with no reported improvements.  Patient remains altered, minimally responsive at times then agitated and yelling at others, PCCM consulted for ICU admit.  ER RN reports pills found with patient were her norvasc, anoro, biotin, and vitamins, nothing of concern.   Pertinent  Medical History  Tobacco abuse, ETOH abuse, COPD, HTN, ?chronic pain  Significant  Hospital Events: Including procedures, antibiotic start and stop dates in addition to other pertinent events   3/6 admit with AMS, PNA, unclear metabolic acidosis  Interim History / Subjective:    Objective   Blood pressure 105/71, pulse 77, temperature 97.6 F (36.4 C), temperature source Oral, resp. rate (!) 24, height '5\' 8"'$  (1.727 m), weight 47.6 kg, SpO2 92 %.       No intake or output data in the 24 hours ending 07/01/22 1619 Filed Weights   07/01/22 0917  Weight: 47.6 kg    Examination: General:  Cachetic appearing older female lying in bed in NAD without being touched.  Upon touching, patient yelling out "let me up" , confused, not following commands and restless trying to sit up in bed.  No apparent nuchal rigidity  HEENT: MM pink/ Neuro: awakens agitated, yelling, trying to sit up, favors/ leans to Right but moving right side, possibly less in RLE but difficult to examine with agitation CV: rr, NSR PULM:  non labored, congested np cough, rhonchi with scattered wheeze GI: soft, somewhat firm mass palpated in lower abd, +bs,  Extremities: warm/dry, +2 pitting LE edema  Skin: severely dry skin/ flaking   Resolved Hospital Problem list    Assessment & Plan:   Acute hypoxic respiratory failure CAP vs aspiration PNA COPD, possible exacerbation  Tobacco abuse  - cont supplemental O2 for sat goal > 90% - NPO while altered, aspiration precautions.  Airway watch with encephalopathy  - cont CAP coverage  with azithro/ ceftriaxone - check MRSA PCR, urine strep and legionella, and RVP - CXR in am  - hold on steroids for now - brovanna, pulmicort, yulperi, and prn albuterol   Sepsis 2/2 to PNA, rule out other processes - follow cultures - trend WBC/ fever curve - CAP coverage as above - MAP remains > 65 - cont MIVF for now while NPO - check echo, EKG non acute, trop hs flat x 2 - pending UA   Acute encephalopathy, unclear etiology, possible related to sepsis vs ETOH  withdrawal (unclear if active abuse, ETOH neg on admit) vs other possible toxic ingestion.  Hx of chronic pain but do not see any pain medications on MAR.  ?synthetic drug not appearing on UDS, but no reported improvements w/ narcan in ER, suggesting against opiates   - CTH neg, UDS neg, ETOH neg - check ammonia, volatile panel, serum osm - high dose thiamine, folic acid  - precedex for RASS goal 0/-1 - Maintain neuro protective measures; goal for eurothermia, euglycemia, eunatermia, normoxia, and PCO2 goal of 35-40 - consider MRI brain if no improvements     NAGMA- etiology remains unclear  AKI - previous sCr 0.75 (05/20/22), given cachexia, ?if renal function underestimated - pending CK> 158, urine Cl, Na, and K  - LR 150 ml/hr given IV dye load - monitor for bladder retention  - Trend BMP / urinary output - Replace electrolytes as indicated - Avoid nephrotoxic agents, ensure adequate renal perfusion   Protein calorie malnutrition Underweight, BMI 15.97 - RD consult  Chronic normocytic anemia - H/H stable, monitor.  No obvious signs of bleeding. Trend CBC   HTN - hold home norvasc  - hx of LE edema, check TTE, also could be nutritional   LLL lung nodule  - enlarged on today's exam from prior CT 04/2022.  Will need f/u imaging to ensure resolution as inflammatory/ infectious given smoking hx   Pelvic mass - seen on CT a/p 04/2022 at [place] - needs outpt GYN followup  Best Practice (right click and "Reselect all SmartList Selections" daily)   Diet/type: NPO DVT prophylaxis: prophylactic heparin  GI prophylaxis: N/A Lines: N/A Foley:  N/A Code Status:  full code Last date of multidisciplinary goals of care discussion [pending]  Brother, Miyona Hyman (? In [place]) has not seen in her in 5 years, not able to provide much information.  Given contact of local friend by brother, Warrick Parisian [phone] unable to be reached at this time.   Labs   CBC: Recent Labs   Lab 07/01/22 0924  WBC 12.0*  NEUTROABS 10.8*  HGB 9.5*  HCT 29.9*  MCV 93.7  PLT 487*    Basic Metabolic Panel: Recent Labs  Lab 07/01/22 0924 07/01/22 1249  NA 137  --   K 4.2  --   CL 107  --   CO2 19*  --   GLUCOSE 114*  --   BUN 42*  --   CREATININE 1.06*  --   CALCIUM 8.2*  --   MG  --  2.0  PHOS  --  4.1   GFR: Estimated Creatinine Clearance: 37.1 mL/min (A) (by C-G formula based on SCr of 1.06 mg/dL (H)). Recent Labs  Lab 07/01/22 0924 07/01/22 1249 07/01/22 1358  WBC 12.0*  --   --   LATICACIDVEN  --  0.6 0.7    Liver Function Tests: Recent Labs  Lab 07/01/22 0924  AST 32  ALT 16  ALKPHOS 59  BILITOT 0.7  PROT 7.3  ALBUMIN 3.2*   No results for input(s): "LIPASE", "AMYLASE" in the last 168 hours. No results for input(s): "AMMONIA" in the last 168 hours.  ABG    Component Value Date/Time   HCO3 19.3 (L) 07/01/2022 1130   ACIDBASEDEF 8.2 (H) 07/01/2022 1130   O2SAT 96.9 07/01/2022 1130     Coagulation Profile: No results for input(s): "INR", "PROTIME" in the last 168 hours.  Cardiac Enzymes: No results for input(s): "CKTOTAL", "CKMB", "CKMBINDEX", "TROPONINI" in the last 168 hours.  HbA1C: No results found for: "HGBA1C"  CBG: Recent Labs  Lab 07/01/22 0923  GLUCAP 106*    Review of Systems:   Unable   Past Medical History:  She,  has a past medical history of Alcohol abuse and COPD (chronic obstructive pulmonary disease) ([place]).   Surgical History:   Past Surgical History:  Procedure Laterality Date   APPENDECTOMY     KNEE SURGERY       Social History:   reports that she has been smoking cigarettes. She has a 44.00 pack-year smoking history. She has never used smokeless tobacco. She reports current alcohol use.   Family History:  Her family history is not on file.   Allergies Allergies  Allergen Reactions   Hydrocodone Nausea And Vomiting   Penicillins     Other Reaction(s): Other  Shaking     Home  Medications  Prior to Admission medications   Not on File     Critical care time: 64 mins       Kennieth Rad, MSN, AG-ACNP-BC  Pulmonary & Critical Care 07/01/2022, 4:19 PM  See Amion for pager If no response to pager, please call PCCM consult pager After 7:00 pm call Elink

## 2022-07-01 NOTE — Progress Notes (Signed)
eLink Physician-Brief Progress Note Patient Name: Nerea Bettinger DOB: [birth year] MRN: [id number]   Date of Service  07/01/2022  HPI/Events of Note  72 year old female admitted with lethargy, hypoxemia, and bilateral lower lobe pneumonia.  Has a history of alcohol use disorder with no previous documented withdrawals who started having worsening agitation and developed hypotension.  Has been weaned off of Precedex but hypotension persist.  The patient is mentating as she presented.  eICU Interventions  Attempt 1 L bolus.  Will initiate peripheral norepinephrine.  Shock workup already instated.  For her agitation, I suspect this is likely a degree of alcohol withdrawal with a potential hospital induced delirium.  For now, initiate CIWA monitoring and as needed Versed (Ativan shortage).  Hold scheduled benzos for now.  EKG reviewed, QTc 438, could use as needed antipsychotics as needed.  Will add Haldol as a second line.   0139 - pt has had no UOP. Bladder scan showing >688cc. -> In and out cath  0300 - refractory hypotension on periphreal levo. MAP increases with stimulation. Change MAP goal to >55, Add midodrine, additional 5% albumin.  Intervention Category Intermediate Interventions: Hypotension - evaluation and management  Lien Lyman 07/01/2022, 11:45 PM

## 2022-07-01 NOTE — Sepsis Progress Note (Signed)
Sepsis bundle complete.  Pt admitted to ICU, monitoring to be continued by bedside RN  

## 2022-07-02 ENCOUNTER — Inpatient Hospital Stay: Payer: Self-pay

## 2022-07-02 ENCOUNTER — Other Ambulatory Visit: Payer: Self-pay

## 2022-07-02 DIAGNOSIS — G9340 Encephalopathy, unspecified: Secondary | ICD-10-CM | POA: Diagnosis not present

## 2022-07-02 LAB — GLUCOSE, CAPILLARY
Glucose-Capillary: 103 mg/dL — ABNORMAL HIGH (ref 70–99)
Glucose-Capillary: 102 mg/dL — ABNORMAL HIGH (ref 70–99)
Glucose-Capillary: 105 mg/dL — ABNORMAL HIGH (ref 70–99)
Glucose-Capillary: 105 mg/dL — ABNORMAL HIGH (ref 70–99)
Glucose-Capillary: 109 mg/dL — ABNORMAL HIGH (ref 70–99)
Glucose-Capillary: 103 mg/dL — ABNORMAL HIGH (ref 70–99)

## 2022-07-02 LAB — CBC
HCT: 29.8 % — ABNORMAL LOW (ref 36.0–46.0)
Hemoglobin: 9.4 g/dL — ABNORMAL LOW (ref 12.0–15.0)
MCH: 29.9 pg (ref 26.0–34.0)
MCHC: 31.5 g/dL (ref 30.0–36.0)
MCV: 94.9 fL (ref 80.0–100.0)
Platelets: 554 10*3/uL — ABNORMAL HIGH (ref 150–400)
RBC: 3.14 MIL/uL — ABNORMAL LOW (ref 3.87–5.11)
RDW: 21.6 % — ABNORMAL HIGH (ref 11.5–15.5)
WBC: 10 10*3/uL (ref 4.0–10.5)
nRBC: 0 % (ref 0.0–0.2)

## 2022-07-02 LAB — VOLATILES,BLD-ACETONE,ETHANOL,ISOPROP,METHANOL
Acetone, blood: <0.01 g/dL (ref 0.000–0.010)
Ethanol, blood: <0.01 g/dL (ref 0.000–0.010)
Isopropanol, blood: <0.01 g/dL (ref 0.000–0.010)
Methanol, blood: <0.01 g/dL (ref 0.000–0.010)

## 2022-07-02 LAB — COOXEMETRY PANEL
Carboxyhemoglobin: 1 % (ref 0.5–1.5)
Methemoglobin: <0.7 % (ref 0.0–1.5)
O2 Saturation: 78.1 %
Total hemoglobin: 8.6 g/dL — ABNORMAL LOW (ref 12.0–16.0)

## 2022-07-02 LAB — URINE CULTURE: Culture: NO GROWTH

## 2022-07-02 LAB — BASIC METABOLIC PANEL
Anion gap: 9 (ref 5–15)
BUN: 27 mg/dL — ABNORMAL HIGH (ref 8–23)
CO2: 22 mmol/L (ref 22–32)
Calcium: 8 mg/dL — ABNORMAL LOW (ref 8.9–10.3)
Chloride: 108 mmol/L (ref 98–111)
Creatinine, Ser: 0.68 mg/dL (ref 0.44–1.00)
GFR, Estimated: >60 mL/min (ref >60)
Glucose, Bld: 115 mg/dL — ABNORMAL HIGH (ref 70–99)
Potassium: 3.5 mmol/L (ref 3.5–5.1)
Sodium: 139 mmol/L (ref 135–145)

## 2022-07-02 LAB — STREP PNEUMONIAE URINARY ANTIGEN: Strep Pneumo Urinary Antigen: NEGATIVE

## 2022-07-02 LAB — HIV ANTIBODY (ROUTINE TESTING W REFLEX): HIV Screen 4th Generation wRfx: NONREACTIVE

## 2022-07-02 LAB — NA AND K (SODIUM & POTASSIUM), RAND UR
Potassium Urine: 55 mmol/L
Sodium, Ur: 33 mmol/L

## 2022-07-02 LAB — CHLORIDE, URINE, RANDOM: Chloride Urine: 41 mmol/L

## 2022-07-02 LAB — PROTIME-INR
INR: 1.3 — ABNORMAL HIGH (ref 0.8–1.2)
Prothrombin Time: 16.5 seconds — ABNORMAL HIGH (ref 11.4–15.2)

## 2022-07-02 LAB — CORTISOL: Cortisol, Plasma: 18.9 ug/dL

## 2022-07-02 MED ORDER — ALBUMIN HUMAN 5 % IV SOLN
25.0000 g | Freq: Once | INTRAVENOUS | Status: AC
  Administered 2022-07-02: 25 g via INTRAVENOUS
  Filled 2022-07-02: qty 500

## 2022-07-02 MED ORDER — NICOTINE 14 MG/24HR TD PT24
14.0000 mg | MEDICATED_PATCH | Freq: Every day | TRANSDERMAL | Status: DC
  Administered 2022-07-02 – 2022-07-09 (×7): 14 mg via TRANSDERMAL
  Filled 2022-07-02 (×7): qty 1

## 2022-07-02 MED ORDER — SODIUM CHLORIDE 0.9% FLUSH
10.0000 mL | Freq: Two times a day (BID) | INTRAVENOUS | Status: DC
  Administered 2022-07-02 – 2022-07-09 (×13): 10 mL

## 2022-07-02 MED ORDER — NOREPINEPHRINE 4 MG/250ML-% IV SOLN
0.0000 ug/min | INTRAVENOUS | Status: DC
  Administered 2022-07-02: 9 ug/min via INTRAVENOUS
  Administered 2022-07-03: 7 ug/min via INTRAVENOUS
  Filled 2022-07-02 (×2): qty 250

## 2022-07-02 MED ORDER — NICOTINE 14 MG/24HR TD PT24: 14.0000 mg | MEDICATED_PATCH | Freq: Every day | TRANSDERMAL | Status: DC

## 2022-07-02 MED ORDER — SODIUM CHLORIDE 0.9% FLUSH: 10.0000 mL | INTRAVENOUS | Status: DC | PRN

## 2022-07-02 MED ORDER — ORAL CARE MOUTH RINSE
15.0000 mL | OROMUCOSAL | Status: DC
  Administered 2022-07-02 – 2022-07-03 (×6): 15 mL via OROMUCOSAL

## 2022-07-02 MED ORDER — POTASSIUM CHLORIDE 10 MEQ/100ML IV SOLN
10.0000 meq | INTRAVENOUS | Status: AC
  Administered 2022-07-02 (×4): 10 meq via INTRAVENOUS
  Filled 2022-07-02 (×4): qty 100

## 2022-07-02 MED ORDER — MIDODRINE HCL 5 MG PO TABS
10.0000 mg | ORAL_TABLET | Freq: Three times a day (TID) | ORAL | Status: DC
  Administered 2022-07-03 – 2022-07-04 (×6): 10 mg via ORAL
  Filled 2022-07-02 (×6): qty 2

## 2022-07-02 NOTE — TOC Initial Note (Signed)
Transition of Care [facility]) - Initial/Assessment Note    Patient Details  Name: Marie Sloan MRN: [id number] Date of Birth: November 16, 1950  Transition of Care [facility]) CM/SW Contact:    Roseanne Kaufman, RN Phone Number: 07/02/2022, 4:33 PM  Clinical Narrative:    Received TOC consult for SA resources, attached to AVS.  TOC will continue to follow for needs,              Barriers to Discharge: Continued Medical Work up   Patient Goals and CMS Choice            Expected Discharge Plan and Services In-house Referral: NA Discharge Planning Services: CM Consult Post Acute Care Choice: NA Living arrangements for the past 2 months: Single Family Home                   DME Agency: NA                  Prior Living Arrangements/Services Living arrangements for the past 2 months: Single Family Home Lives with:: Self                   Activities of Daily Living Home Assistive Devices/Equipment: None ADL Screening (condition at time of admission) Patient's cognitive ability adequate to safely complete daily activities?: Yes Is the patient deaf or have difficulty hearing?: No Does the patient have difficulty seeing, even when wearing glasses/contacts?: No Does the patient have difficulty concentrating, remembering, or making decisions?: No Patient able to express need for assistance with ADLs?: No Does the patient have difficulty dressing or bathing?: No Independently performs ADLs?: Yes (appropriate for developmental age) Does the patient have difficulty walking or climbing stairs?: No Weakness of Legs: None Weakness of Arms/Hands: None  Permission Sought/Granted                  Emotional Assessment         Alcohol / Substance Use: Alcohol Use, Tobacco Use    Admission diagnosis:  Metabolic acidosis 123XX123 Acute encephalopathy [G93.40] Community acquired pneumonia, unspecified laterality [J18.9] Patient Active Problem List   Diagnosis Date Noted    Acute encephalopathy 07/01/2022   PCP:  Patient, No Pcp Per Pharmacy:   [id number] [place] [address]Phone: [phone]: [phone]    Social Determinants of Health (SDOH) Social History: SDOH Screenings   Food Insecurity: No Food Insecurity (07/02/2022)  Housing: Low Risk  (07/02/2022)  Transportation Needs: No Transportation Needs (07/02/2022)  Utilities: Not At Risk (07/02/2022)  Tobacco Use: High Risk (07/01/2022)   SDOH Interventions:     Readmission Risk Interventions     No data to display

## 2022-07-02 NOTE — Progress Notes (Signed)
   07/02/22 1025  Oxygen Therapy/Pulse Ox  O2 Device (S)  HFNC  O2 Therapy Oxygen humidified  O2 Flow Rate (L/min) (S)  6 L/min  FiO2 (%) 48 %  SpO2 92 %

## 2022-07-02 NOTE — Progress Notes (Signed)
Pt friend/co-worker, Tammy, came to pick up house key as was previously arranged. We discussed whether patients' meds would be at her home or in her suitcase. Suitcase whereabouts currently unknown. Patient's son is supposed to follow-up.

## 2022-07-02 NOTE — Progress Notes (Signed)
Initial Nutrition Assessment  DOCUMENTATION CODES:   Underweight  INTERVENTION:   -Checking vitamin labs given suspected poor PO and alcohol abuse: Vitamin D, Vitamin B-12  -If pt to remain NPO, recommend small bore NGT placement and nutrition support initiation  If diet advanced recommend: -Ensure Plus High Protein po TID, each supplement provides 350 kcal and 20 grams of protein.  -Monitor magnesium, potassium, and phosphorus for at least 3 days, MD to replete as needed, as pt is at risk for refeeding syndrome. -Multivitamin with minerals daily -Juven BID , each serving provides 95kcal and 2.5g of protein (amino acids glutamine and arginine) to aid in wound healing   NUTRITION DIAGNOSIS:   Increased nutrient needs related to chronic illness as evidenced by estimated needs.  GOAL:   Patient will meet greater than or equal to 90% of their needs  MONITOR:   Diet advancement, Labs, Weight trends, I & O's, Skin  REASON FOR ASSESSMENT:   Consult Assessment of nutrition requirement/status  ASSESSMENT:   71yF with history of etoh use (unconfirmed recent use), COPD, recently discovered large pelvic teratoma who was found to be decreasingly responsive as she was boarding a plane on her way to her mom's 100th birthday party.      Here she was responsive to noxious stim and would become intermittently agitated with stimulation. She has mild metabolic acidosis and secondary respiratory acidosis.  Patient in room, disoriented and agitated. Mitts over hands. Pt answered with "I don't know" to most questions. Eventually when attempting to gather weight history, pt asks "What do you need?". Will attempt to gather history and NFPE at follow-up when pt has improved mental status.  Per chart review, pt with encephalopathy r/t possible alcohol withdrawal or toxic ingestion. Noted in CCM note 3/6, pt reported poor PO in the [place] ED on 1/23. Suspect this is ongoing. Suspect some degree of  malnutrition but unable to diagnose at this time.   Limited weight history available in chart. Last weight PTA was from 2018.  Current weight: 105 lbs  Medications: IV Folic acid, Thiamine, Precedex, Lactated ringers, Levophed, KCl  Labs reviewed: CBGs: 68-105   NUTRITION - FOCUSED PHYSICAL EXAM:  Deferred given patient agitation and not cooperative.   Diet Order:   Diet Order             Diet NPO time specified  Diet effective now                   EDUCATION NEEDS:   Not appropriate for education at this time  Skin:  Skin Assessment: Skin Integrity Issues: Skin Integrity Issues:: Stage II Stage II: left buttocks  Last BM:  PTA  Height:   Ht Readings from Last 1 Encounters:  07/01/22 '5\' 8"'$  (1.727 m)    Weight:   Wt Readings from Last 1 Encounters:  07/02/22 47.8 kg    BMI:  Body mass index is 16.02 kg/m.  Estimated Nutritional Needs:   Kcal:  1900-2100  Protein:  95-105g  Fluid:  2.1L/day  Marie Bibles, MS, RD, LDN Inpatient Clinical Dietitian Contact information available via Amion

## 2022-07-02 NOTE — Progress Notes (Signed)
2330: Contacted E-Link nurse for retractable hypotension despite precedex titration to 0.1 (performed as ordered) and ultimately turned off. Pt. Became agitated, began yelling, and was restless in her bed (RASS +4).  E-Link Dr. joined remotely and placed orders for USG IV placement, Levophed administration, PRN Haldol and Versed.   I also administered 25  mL D5 via IV at this time for BG of 68.  I scanned her bladder w/ result of > 668; i/o cath resulted in UOP of 600 mL.  0230: Contacted e-link again for continued hypotension despite Levo up to max of 10 w/ HR 50.  Started albumin  Please see Levie Heritage, MD note for clarification

## 2022-07-02 NOTE — Progress Notes (Signed)
[facility] ADULT ICU REPLACEMENT PROTOCOL   The patient does apply for the [facility] Adult ICU Electrolyte Replacment Protocol based on the criteria listed below:   1.Exclusion criteria: TCTS, ECMO, Dialysis, and Myasthenia Gravis patients 2. Is GFR >/= 30 ml/min? Yes.    Patient's GFR today is >60 3. Is SCr </= 2? Yes.   Patient's SCr is 0.68 mg/dL 4. Did SCr increase >/= 0.5 in 24 hours? No. 5.Pt's weight >40kg  Yes.   6. Abnormal electrolyte(s): potassium 3.5  7. Electrolytes replaced per protocol 8.  Call MD STAT for K+ </= 2.5, Phos </= 1, or Mag </= 1 Physician:  protocol  Darlys Gales 07/02/2022 4:36 AM

## 2022-07-02 NOTE — Progress Notes (Signed)
Peripherally Inserted Central Catheter Placement  The IV Nurse has discussed with the patient and/or persons authorized to consent for the patient, the purpose of this procedure and the potential benefits and risks involved with this procedure.  The benefits include less needle sticks, lab draws from the catheter, and the patient may be discharged home with the catheter. Risks include, but not limited to, infection, bleeding, blood clot (thrombus formation), and puncture of an artery; nerve damage and irregular heartbeat and possibility to perform a PICC exchange if needed/ordered by physician.  Alternatives to this procedure were also discussed.  Bard Power PICC patient education guide, fact sheet on infection prevention and patient information card has been provided to patient /or left at bedside.    PICC Placement Documentation  PICC Double Lumen 07/02/22 Left Brachial 40 cm 0 cm (Active)  Indication for Insertion or Continuance of Line Vasoactive infusions 07/02/22 1723  Exposed Catheter (cm) 0 cm 07/02/22 1723  Site Assessment Clean, Dry, Intact 07/02/22 1723  Lumen #1 Status Flushed;Saline locked;Blood return noted 07/02/22 1723  Lumen #2 Status Flushed;Saline locked;Blood return noted 07/02/22 1723  Dressing Type Transparent;Securing device 07/02/22 1723  Dressing Status Antimicrobial disc in place 07/02/22 1723  Dressing Intervention New dressing;Other (Comment) 07/02/22 1723  Dressing Change Due 07/09/22 07/02/22 1723    Telephone consent signed by brother   Synthia Innocent 07/02/2022, 5:25 PM

## 2022-07-02 NOTE — Progress Notes (Signed)
An USGPIV (ultrasound guided PIV) has been placed for short-term vasopressor infusion. A correctly placed ivWatch must be used when administering Vasopressors. Should this treatment be needed beyond 72 hours, central line access should be obtained.  It will be the responsibility of the bedside nurse to follow best practice to prevent extravasations.   

## 2022-07-02 NOTE — Progress Notes (Addendum)
NAME:  Marie Sloan, MRN:  [id number], DOB:  12/29/1950, LOS: 1 ADMISSION DATE:  07/01/2022, CONSULTATION DATE:  07/01/22 REFERRING MD:  TRH, CHIEF COMPLAINT:  AMS   History of Present Illness:   HPI obtained from EMR review given patient's ongoing encephalopathy.   72 year old female with PMH significant for tobacco abuse, ?ETOH use, HTN, and COPD presenting to [facility] ER with altered mental status and hypoxia from airport (was going to visit her mother who is 100 in [place]).  Patient was found minimally responsive by flight attendants on a flight to [place] with pills in her lap.  EMS reported room air sats 86%.  Arrived responsive to noxious stimuli in ER on NRB.     Recently evaluated in [facility] ER 05/19/22 with multiple complaints of weakness, poor PO intake, LE edema, and recent pneumonia evaluated for possible GIB given Hgb 7.7.  Additionally found to have left lung nodule LLL 67m, and large pelvic mass thought to be possible teratoma.  She declined further GI evaluation/ EGD, echo, and was to follow up in outpatient.   Pt afebrile, HR NSR, SBP 90-110's, and requiring 4L [place].  ER workup notable for CO2 19, glucose 114, BUN/ sCr 42/ 1.06, albumin 3.2, trop hs neg 7> 8, normal lactic x 2, WBC 12, H/H 9.5/ 29.9, INR 1.4, neg SARS/ flu/ RSV, ETOH neg, UDS neg, CTH negative for acute intracranial process, CTA PE was neg for PE but showed patchy lower alveolar infiltrates, R> L suggestive of atelectasis vs pneumonia, with scattered ground glass densities in upper lung fields, and 11 mm pleural based LLL nodule.  Cultures sent and started on azithromycin and ceftriaxone, with 1.5L LR bolus.  Narcan given with no reported improvements.  Patient remains altered, minimally responsive at times then agitated and yelling at others, PCCM consulted for ICU admit.  ER RN reports pills found with patient were her norvasc, anoro, biotin, and vitamins, nothing of concern for overdose.   Pertinent  Medical History   Tobacco abuse, ETOH abuse, COPD, HTN, ?chronic pain  Significant Hospital Events: Including procedures, antibiotic start and stop dates in addition to other pertinent events   3/6 admit with AMS, PNA, unclear metabolic acidosis  Interim History / Subjective:  Afebrile  [place] 8L UOP 600 ml, +4.1L in 24 h Pt denies pain, SOB   Her friend reports she has had a lot of dental work completed in the last year.  She has lost a lot of weight. She normally eats alone b/c of her teeth.  She has been on multiple abx (amoxicillin, doxy and clinda), lots of unopened meds at home.  She has had diarrhea at home > once to the point that she had an accident at work and had to change her clothes.    Objective   Blood pressure (!) 99/45, pulse (!) 54, temperature (!) 96.9 F (36.1 C), temperature source Axillary, resp. rate 14, height '5\' 8"'$  (1.727 m), weight 47.8 kg, SpO2 92 %.        Intake/Output Summary (Last 24 hours) at 07/02/2022 1000 Last data filed at 07/02/2022 0932 Gross per 24 hour  Intake 5857.32 ml  Output 600 ml  Net 5257.32 ml   Filed Weights   07/01/22 0917 07/01/22 1741 07/02/22 0701  Weight: 47.6 kg 47.5 kg 47.8 kg    Examination: General: cachectic adult female lying in bed in NAD HEENT: MM pink/moist, anicteric, pupils =/reactive, [place] O2 with CO2 monitor Neuro: lying with eyes closed,  opens eyes / states she is at the hospital when asked, rolls her eyes at provider questions, calm CV: s1s2 RRR, no m/r/g PULM: non-labored at rest, lungs bilaterally diminished  GI: soft, bsx4 active  Extremities: warm/dry, BLE 2+pitting edema  Skin: no rashes or lesions. Dry / flaky skin  Resolved Hospital Problem list     Assessment & Plan:   Acute hypoxic respiratory failure CAP vs aspiration PNA COPD, possible exacerbation  Tobacco abuse  Pulmonary HTN (PAS 50 in 2018 on TTE) Strep antigen negative.  -wean O2 for sats >90% -pulmonary hygiene - IS, mobilize -NPO until asking for food  / more alert -aspiration precautions -continue brovana, pulmicort, yupelri  -CAP coverage with rocephin + azithromycin  -follow up HIV, legionella antigen    Sepsis 2/2 to PNA, rule out other processes Shock - multifactorial in setting of malnutrition, sedation, possible infection  UA negative. EKG non-acute, trop flat. Query what baseline BP runs normally.  -follow cultures to maturity  -wean levophed for MAP >65  -midodrine  -assess cortisol  -albumin as ordered  -monitor fever curve / WBC trend  -ABX as above  -LR at 130m/hr with stop time in place / while NPO  -await ECHO   Acute encephalopathy, unclear etiology, possible related to sepsis vs ETOH withdrawal (unclear if active abuse, ETOH neg on admit) vs other possible toxic ingestion.  Hx of chronic pain but do not see any pain medications on MAR.  ?synthetic drug not appearing on UDS, but no reported improvements w/ narcan in ER, suggesting against opiates.  Ammonia, CT head, UDS, ETOH negative. Serum OSM 314 -follow up volatiles  -wean precedex for RASS of 0  -slow improvement  -continue thiamine, folic acid for now -continue end tidal CO2 for now   NAGMA- etiology remains unclear but may have been diarrhea.  AKI Previous sCr 0.75 (05/20/22), given cachexia, ?if renal function underestimated.   -complete LR infusion while NPO / prior dye load  -CK negative -Trend BMP / urinary output -Replace electrolytes as indicated -Avoid nephrotoxic agents, ensure adequate renal perfusion   Protein calorie malnutrition Underweight, BMI 15.97.   -Nutrition consult    Chronic normocytic anemia -follow H/H    HTN LE Edema  -hold home norvasc -follow up ECHO  -query if LE edema is related to EF / PTruckeeor malnutrition    LLL lung nodule  Enlarged on admit exam from prior CT 04/2022.   -will need outpatient follow up of nodule to ensure resolution > infectious vs inflammatory vs malignant given smoking hx    Pelvic  mass [place]had some issues with urinary retention on admit  -will need follow up with URO/GYN as outpatient   Best Practice (right click and "Reselect all SmartList Selections" daily)  Diet/type: NPO DVT prophylaxis: prophylactic heparin  GI prophylaxis: N/A Lines: N/A Foley:  N/A Code Status:  full code Last date of multidisciplinary goals of care discussion [pending]  Brother, JNataliee Rolstad(? [place] has not seen in her in 5 years, not able to provide much information.  Given contact of local friend by brother, TWarrick [phone]   Tammy updated on plan of care 3/7.   Critical care time: 33 minutes    BNoe Gens MSN, APRN, NP-C, AGACNP-BC [facility] 07/02/2022, 10:00 AM   Please see [email or web address] for pager details.   From 7A-7P if no response, please call [phone] After hours, please call ELink  [phone]       

## 2022-07-02 NOTE — Progress Notes (Signed)
   07/02/22 0805  Aerosol Therapy Tx  $ Hand Held Nebulizer  2  Medications (S)  Brovana;Pulmicort (Pt wasted medication, refused and fought against treatment, treatment stopped.  RT stopped treatment. 8 L [place].)  Solution Normal saline  Delivery Source Oxygen  Delivery Device HHN  Pre-Treatment Pulse 72  Pre-Treatment Respirations 16  Treatment Tolerance Tolerated poorly  Treatment Given 2  RT Breath Sounds  Bilateral Breath Sounds (S)  Diminished;Rhonchi (Rhonchi greater on the right.)

## 2022-07-03 ENCOUNTER — Inpatient Hospital Stay (HOSPITAL_COMMUNITY): Payer: Medicare HMO

## 2022-07-03 DIAGNOSIS — R0602 Shortness of breath: Secondary | ICD-10-CM | POA: Diagnosis not present

## 2022-07-03 DIAGNOSIS — J189 Pneumonia, unspecified organism: Secondary | ICD-10-CM

## 2022-07-03 DIAGNOSIS — G9340 Encephalopathy, unspecified: Secondary | ICD-10-CM | POA: Diagnosis not present

## 2022-07-03 LAB — VITAMIN D 25 HYDROXY (VIT D DEFICIENCY, FRACTURES): Vit D, 25-Hydroxy: 26.65 ng/mL — ABNORMAL LOW (ref 30–100)

## 2022-07-03 LAB — ECHOCARDIOGRAM COMPLETE
Area-P 1/2: 3.91 cm2
Height: 68 in
S' Lateral: 1.8 cm
Weight: 1873.03 oz

## 2022-07-03 LAB — GLUCOSE, CAPILLARY
Glucose-Capillary: 100 mg/dL — ABNORMAL HIGH (ref 70–99)
Glucose-Capillary: 104 mg/dL — ABNORMAL HIGH (ref 70–99)
Glucose-Capillary: 106 mg/dL — ABNORMAL HIGH (ref 70–99)
Glucose-Capillary: 109 mg/dL — ABNORMAL HIGH (ref 70–99)
Glucose-Capillary: 116 mg/dL — ABNORMAL HIGH (ref 70–99)
Glucose-Capillary: 79 mg/dL (ref 70–99)
Glucose-Capillary: 84 mg/dL (ref 70–99)

## 2022-07-03 LAB — CBC
HCT: 26 % — ABNORMAL LOW (ref 36.0–46.0)
Hemoglobin: 8.4 g/dL — ABNORMAL LOW (ref 12.0–15.0)
MCH: 30.4 pg (ref 26.0–34.0)
MCHC: 32.3 g/dL (ref 30.0–36.0)
MCV: 94.2 fL (ref 80.0–100.0)
Platelets: 448 10*3/uL — ABNORMAL HIGH (ref 150–400)
RBC: 2.76 MIL/uL — ABNORMAL LOW (ref 3.87–5.11)
RDW: 21.4 % — ABNORMAL HIGH (ref 11.5–15.5)
WBC: 6.3 10*3/uL (ref 4.0–10.5)
nRBC: 0 % (ref 0.0–0.2)

## 2022-07-03 LAB — BASIC METABOLIC PANEL
Anion gap: 5 (ref 5–15)
BUN: 14 mg/dL (ref 8–23)
CO2: 21 mmol/L — ABNORMAL LOW (ref 22–32)
Calcium: 7.9 mg/dL — ABNORMAL LOW (ref 8.9–10.3)
Chloride: 110 mmol/L (ref 98–111)
Creatinine, Ser: 0.54 mg/dL (ref 0.44–1.00)
GFR, Estimated: >60 mL/min (ref >60)
Glucose, Bld: 103 mg/dL — ABNORMAL HIGH (ref 70–99)
Potassium: 3.6 mmol/L (ref 3.5–5.1)
Sodium: 136 mmol/L (ref 135–145)

## 2022-07-03 LAB — VITAMIN B12: Vitamin B-12: 272 pg/mL (ref 180–914)

## 2022-07-03 MED ORDER — SODIUM CHLORIDE 3 % IN NEBU
4.0000 mL | INHALATION_SOLUTION | Freq: Two times a day (BID) | RESPIRATORY_TRACT | Status: AC
  Administered 2022-07-03 – 2022-07-05 (×6): 4 mL via RESPIRATORY_TRACT
  Filled 2022-07-03 (×6): qty 4

## 2022-07-03 MED ORDER — GUAIFENESIN ER 600 MG PO TB12
1200.0000 mg | ORAL_TABLET | Freq: Two times a day (BID) | ORAL | Status: DC
  Administered 2022-07-03 – 2022-07-09 (×13): 1200 mg via ORAL
  Filled 2022-07-03 (×13): qty 2

## 2022-07-03 NOTE — Progress Notes (Signed)
  Echocardiogram 2D Echocardiogram has been performed.  Marie Sloan 07/03/2022, 9:24 AM

## 2022-07-03 NOTE — Progress Notes (Signed)
NAME:  Marie Sloan, MRN:  [id number], DOB:  1950/10/27, LOS: 2 ADMISSION DATE:  07/01/2022, CONSULTATION DATE:  07/01/22 REFERRING MD:  TRH, CHIEF COMPLAINT:  AMS   History of Present Illness:  72 year old female with PMH significant for tobacco abuse, ?ETOH use, HTN, and COPD presenting to [facility] ER with altered mental status and hypoxia from airport (was going to visit her mother who is 100 in [place]).  Patient was found minimally responsive by flight attendants on a flight to [place] with pills in her lap.  EMS reported room air sats 86%.  Arrived responsive to noxious stimuli in ER on NRB.     Recently evaluated in [facility] ER 05/19/22 with multiple complaints of weakness, poor PO intake, LE edema, and recent pneumonia evaluated for possible GIB given Hgb 7.7.  Additionally found to have left lung nodule LLL 76m, and large pelvic mass thought to be possible teratoma.  She declined further GI evaluation/ EGD, echo, and was to follow up in outpatient.   Pt afebrile, HR NSR, SBP 90-110's, and requiring 4L [place].  ER workup notable for CO2 19, glucose 114, BUN/ sCr 42/ 1.06, albumin 3.2, trop hs neg 7> 8, normal lactic x 2, WBC 12, H/H 9.5/ 29.9, INR 1.4, neg SARS/ flu/ RSV, ETOH neg, UDS neg, CTH negative for acute intracranial process, CTA PE was neg for PE but showed patchy lower alveolar infiltrates, R> L suggestive of atelectasis vs pneumonia, with scattered ground glass densities in upper lung fields, and 11 mm pleural based LLL nodule.  Cultures sent and started on azithromycin and ceftriaxone, with 1.5L LR bolus.  Narcan given with no reported improvements.  Patient remains altered, minimally responsive at times then agitated and yelling at others, PCCM consulted for ICU admit.  ER RN reports pills found with patient were her norvasc, anoro, biotin, and vitamins, nothing of concern for overdose.   Pertinent  Medical History  Tobacco abuse, ETOH abuse, COPD, HTN, ?chronic pain  Significant  Hospital Events: Including procedures, antibiotic start and stop dates in addition to other pertinent events   3/6 admit with AMS, PNA, unclear metabolic acidosis  Interim History / Subjective:  Tmax 99.4 8L [place]  Levophed 832m > weaned to 71m471mPt reports cough with difficulty clearing secretions, smoked since age 66 24ppd at heaviest), she was sick prior to getting on her flight.    Objective   Blood pressure (!) 124/53, pulse 67, temperature 99.4 F (37.4 C), temperature source Oral, resp. rate 19, height '5\' 8"'$  (1.727 m), weight 47.8 kg, SpO2 94 %.    FiO2 (%):  [48 %] 48 %   Intake/Output Summary (Last 24 hours) at 07/03/2022 0826 Last data filed at 07/03/2022 0800 Gross per 24 hour  Intake 2090.04 ml  Output 800 ml  Net 1290.04 ml   Filed Weights   07/01/22 0917 07/01/22 1741 07/02/22 0701  Weight: 47.6 kg 47.5 kg 47.8 kg    Examination: General:  frail elderly female lying in bed in NAD HEENT: MM pink/moist, anicteric, wearing glasses, [place] O2 Neuro: AAOx4, speech clear, MAE / generalized weakness  CV: s1s2 RRR, no m/r/g PULM: non-labored at rest, Right basilar crackles, coarse / moist cough but no sputum production  GI: soft, bsx4 active  Extremities: warm/dry, BLE 1+ edema improved from 3/7 exam   Skin: no rashes or lesions  BCx2 3/6 >>  UC 3/6 >> negative   Resolved Hospital Problem list     Assessment & Plan:  Acute hypoxic respiratory failure CAP vs aspiration PNA COPD, possible exacerbation  Tobacco abuse  Pulmonary HTN (PAS 50 in 2018 on TTE) Strep antigen, HIV,  negative.  -wean O2 for sats 88-94% -continue brovana, pulmicort & yupelri  -CAP coverage with rocephin + azithromycin  -follow up legionella antigen   Sepsis 2/2 to PNA, rule out other processes Shock R/o other source. Multifactorial hypotension in setting of malnutrition, sedation, possible infection. UA negative. EKG non-acute, trop flat. Query what baseline BP runs normally. Cortisol 18.  S/p albumin.  -follow cultures -wean levophed & midodrine to off for MAP >65  -follow fever curve / WBC trend  -ABX as above  -await ECHO  Acute Metabolic Encephalopathy Unclear etiology, possible related to sepsis vs ETOH withdrawal (unclear if active abuse, ETOH neg on admit) vs other possible toxic ingestion.  Hx of chronic pain but do not see any pain medications on MAR.  ?synthetic drug not appearing on UDS, but no reported improvements w/ narcan in ER, suggesting against opiates.  Ammonia, CT head, UDS, ETOH, volatiles negative. Serum OSM 314 -supportive care  -continue thiamine, folic acid  -discontinue end tidal  -PT efforts   NAGMA- etiology remains unclear but may have been diarrhea.  AKI Previous sCr 0.75 (05/20/22), given cachexia, ?if renal function underestimated.   -Trend BMP / urinary output -Replace electrolytes as indicated -Avoid nephrotoxic agents, ensure adequate renal perfusion  Protein calorie malnutrition Underweight, BMI 15.97.   -advance to regular diet as tolerated  -add ensure BID now that she is awake enough to eat   Chronic normocytic anemia -trend H/H  -outpatient follow up   HTN LE Edema  -hold home norvasc  -follow up ECHO   LLL lung nodule  Enlarged on admit exam from prior CT 04/2022.   -will need outpatient follow up of nodule to ensure resolution > infectious vs inflammatory vs malignant given smoking hx   Pelvic Mass Seen on CT a/p 04/2022 at [place] -follow up with URO/GYN as outpatient    Best Practice (right click and "Reselect all SmartList Selections" daily)  Diet/type: NPO DVT prophylaxis: prophylactic heparin  GI prophylaxis: N/A Lines: Central line and yes and it is still needed Foley:  N/A Code Status:  full code Last date of multidisciplinary goals of care discussion: full code.  Plan of care discussed with patient.    Brother, Benjamin Reynen (? In [place]) has not seen in her in 5 years, not able to provide much  information.  Given contact of local friend by brother, Warrick Parisian [phone].   Critical care time: 79 minutes    Noe Gens, MSN, APRN, NP-C, AGACNP-BC [facility] 07/03/2022, 8:26 AM   Please see [email or web address] for pager details.   From 7A-7P if no response, please call [phone] After hours, please call ELink [phone]

## 2022-07-04 ENCOUNTER — Inpatient Hospital Stay (HOSPITAL_COMMUNITY): Payer: Medicare HMO

## 2022-07-04 DIAGNOSIS — J189 Pneumonia, unspecified organism: Secondary | ICD-10-CM | POA: Diagnosis not present

## 2022-07-04 DIAGNOSIS — A419 Sepsis, unspecified organism: Secondary | ICD-10-CM

## 2022-07-04 DIAGNOSIS — R911 Solitary pulmonary nodule: Secondary | ICD-10-CM

## 2022-07-04 DIAGNOSIS — F17210 Nicotine dependence, cigarettes, uncomplicated: Secondary | ICD-10-CM | POA: Diagnosis present

## 2022-07-04 DIAGNOSIS — J9602 Acute respiratory failure with hypercapnia: Secondary | ICD-10-CM | POA: Diagnosis not present

## 2022-07-04 DIAGNOSIS — J9601 Acute respiratory failure with hypoxia: Secondary | ICD-10-CM

## 2022-07-04 DIAGNOSIS — E43 Unspecified severe protein-calorie malnutrition: Secondary | ICD-10-CM

## 2022-07-04 DIAGNOSIS — G9341 Metabolic encephalopathy: Secondary | ICD-10-CM

## 2022-07-04 DIAGNOSIS — R6521 Severe sepsis with septic shock: Secondary | ICD-10-CM

## 2022-07-04 DIAGNOSIS — D489 Neoplasm of uncertain behavior, unspecified: Secondary | ICD-10-CM | POA: Diagnosis present

## 2022-07-04 DIAGNOSIS — L8990 Pressure ulcer of unspecified site, unspecified stage: Secondary | ICD-10-CM | POA: Insufficient documentation

## 2022-07-04 DIAGNOSIS — E876 Hypokalemia: Secondary | ICD-10-CM

## 2022-07-04 LAB — BASIC METABOLIC PANEL
Anion gap: 7 (ref 5–15)
BUN: 9 mg/dL (ref 8–23)
CO2: 21 mmol/L — ABNORMAL LOW (ref 22–32)
Calcium: 7.8 mg/dL — ABNORMAL LOW (ref 8.9–10.3)
Chloride: 106 mmol/L (ref 98–111)
Creatinine, Ser: 0.54 mg/dL (ref 0.44–1.00)
GFR, Estimated: >60 mL/min (ref >60)
Glucose, Bld: 81 mg/dL (ref 70–99)
Potassium: 3.4 mmol/L — ABNORMAL LOW (ref 3.5–5.1)
Sodium: 134 mmol/L — ABNORMAL LOW (ref 135–145)

## 2022-07-04 LAB — IRON AND TIBC
Iron: 24 ug/dL — ABNORMAL LOW (ref 28–170)
Saturation Ratios: 12 % (ref 10.4–31.8)
TIBC: 196 ug/dL — ABNORMAL LOW (ref 250–450)
UIBC: 172 ug/dL

## 2022-07-04 LAB — BLOOD GAS, VENOUS
Acid-base deficit: 3.2 mmol/L — ABNORMAL HIGH (ref 0.0–2.0)
Bicarbonate: 23.6 mmol/L (ref 20.0–28.0)
O2 Saturation: 88.5 %
Patient temperature: 37
pCO2, Ven: 48 mmHg (ref 44–60)
pH, Ven: 7.3 (ref 7.25–7.43)
pO2, Ven: 51 mmHg — ABNORMAL HIGH (ref 32–45)

## 2022-07-04 LAB — RETICULOCYTES
Immature Retic Fract: 23.8 % — ABNORMAL HIGH (ref 2.3–15.9)
RBC.: 2.85 MIL/uL — ABNORMAL LOW (ref 3.87–5.11)
Retic Count, Absolute: 81.5 10*3/uL (ref 19.0–186.0)
Retic Ct Pct: 2.9 % (ref 0.4–3.1)

## 2022-07-04 LAB — FERRITIN: Ferritin: 58 ng/mL (ref 11–307)

## 2022-07-04 LAB — GLUCOSE, CAPILLARY
Glucose-Capillary: 78 mg/dL (ref 70–99)
Glucose-Capillary: 80 mg/dL (ref 70–99)
Glucose-Capillary: 95 mg/dL (ref 70–99)
Glucose-Capillary: 107 mg/dL — ABNORMAL HIGH (ref 70–99)
Glucose-Capillary: 94 mg/dL (ref 70–99)

## 2022-07-04 LAB — CBC
HCT: 26.9 % — ABNORMAL LOW (ref 36.0–46.0)
Hemoglobin: 8.4 g/dL — ABNORMAL LOW (ref 12.0–15.0)
MCH: 30.1 pg (ref 26.0–34.0)
MCHC: 31.2 g/dL (ref 30.0–36.0)
MCV: 96.4 fL (ref 80.0–100.0)
Platelets: 362 10*3/uL (ref 150–400)
RBC: 2.79 MIL/uL — ABNORMAL LOW (ref 3.87–5.11)
RDW: 21.4 % — ABNORMAL HIGH (ref 11.5–15.5)
WBC: 4.7 10*3/uL (ref 4.0–10.5)
nRBC: 0 % (ref 0.0–0.2)

## 2022-07-04 LAB — TSH: TSH: 1.802 u[IU]/mL (ref 0.350–4.500)

## 2022-07-04 LAB — FOLATE: Folate: 14.5 ng/mL (ref >5.9)

## 2022-07-04 MED ORDER — POTASSIUM CHLORIDE CRYS ER 20 MEQ PO TBCR
40.0000 meq | EXTENDED_RELEASE_TABLET | Freq: Once | ORAL | Status: AC
  Administered 2022-07-04: 40 meq via ORAL
  Filled 2022-07-04: qty 2

## 2022-07-04 MED ORDER — FUROSEMIDE 10 MG/ML IJ SOLN
40.0000 mg | Freq: Once | INTRAMUSCULAR | Status: AC
  Administered 2022-07-04: 40 mg via INTRAVENOUS
  Filled 2022-07-04: qty 4

## 2022-07-04 MED ORDER — POTASSIUM CHLORIDE CRYS ER 20 MEQ PO TBCR: 40.0000 meq | EXTENDED_RELEASE_TABLET | Freq: Once | ORAL | Status: DC

## 2022-07-04 MED ORDER — ACETAMINOPHEN 325 MG PO TABS
650.0000 mg | ORAL_TABLET | ORAL | Status: DC | PRN
  Administered 2022-07-04 – 2022-07-09 (×13): 650 mg via ORAL
  Filled 2022-07-04 (×14): qty 2

## 2022-07-04 NOTE — Assessment & Plan Note (Signed)
Replaced. °

## 2022-07-04 NOTE — Assessment & Plan Note (Signed)
.   Patient is being counseled daily on smoking cessation. . Providing patient with nicotine replacement therapy during this hospitalization.   

## 2022-07-04 NOTE — Assessment & Plan Note (Signed)
Treating pneumonia as noted above

## 2022-07-04 NOTE — Progress Notes (Signed)
Pt's coworker/friend Tammy Blevin brought in patient's house key. Key placed in patients belonging bag.

## 2022-07-04 NOTE — Assessment & Plan Note (Addendum)
Continuing to wean supplemental oxygen, currently on low rate nasal cannula Multifactorial respiratory failure felt to be secondary to bibasilar pneumonia with imposed acute pulmonary edema and bilateral pleural effusions I believe patient may still have some residual pleural effusion.  Will administer an additional 2 doses of intravenous Lasix throughout the day and repeat chest x-ray tomorrow. Continue to treat underlying pneumonia with intravenous antibiotics with ceftriaxone and azithromycin, will treat with total of 7 days of antibiotics Associated septic shock has resolved. Cultures negative

## 2022-07-04 NOTE — Assessment & Plan Note (Signed)
Recently identified during January 2024 hospitalization at [facility] follow-up

## 2022-07-04 NOTE — Progress Notes (Signed)
[facility] ADULT ICU REPLACEMENT PROTOCOL   The patient does apply for the [facility] Adult ICU Electrolyte Replacment Protocol based on the criteria listed below:   1.Exclusion criteria: TCTS, ECMO, Dialysis, and Myasthenia Gravis patients 2. Is GFR >/= 30 ml/min? Yes.    Patient's GFR today is >60 3. Is SCr </= 2? Yes.   Patient's SCr is 0.54 mg/dL 4. Did SCr increase >/= 0.5 in 24 hours? No. 5.Pt's weight >40kg  Yes.   6. Abnormal electrolyte(s):   K 3.4  7. Electrolytes replaced per protocol 8.  Call MD STAT for K+ </= 2.5, Phos </= 1, or Mag </= 1 Physician:  S. Rosie Fate R Carmela Piechowski 07/04/2022 5:13 AM

## 2022-07-04 NOTE — Hospital Course (Addendum)
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  Patient presenting to [facility] emergency department via EMS from the local airport after being identified to be minimally responsive and lethargic by flight attendants prior to taking off on a [place] bound flight.  Upon evaluation in the emergency department patient was found to have bilateral lower lobe infiltrates concerning for pneumonia.  With clinical findings concerning for concurrent sepsis.  Patient was also found to be extremely agitated and was initiated on Precedex.  The PCCM service was called and patient was assessed for admission to the hospital.  During the patient's ICU course, patient developed septic shock.  Patient continued to receive intravenous antibiotics with ceftriaxone and azithromycin.  In the days that followed patient's blood pressures did improve and patient was transitioned from Levophed to midodrine and ultimately discontinued altogether.  Patient was accepted to the hospitalist service the morning of 3/9.  In the latter half of the patient's hospitalization, patient had a recurrent surgeons of acute hypoxic respiratory failure with chest imaging revealing pulmonary edema and worsening bilateral pleural effusions.  Echocardiogram revealed diastolic congestive heart failure and it was felt the patient was suffering from acute cardiogenic volume overload that developed after aggressive volume resuscitation earlier in the hospitalization.  Patient was temporarily placed on heated high flow oxygen delivery and patient was aggressively diuresed with intravenous Lasix over the next several days.

## 2022-07-04 NOTE — Progress Notes (Addendum)
NAME:  Marie Sloan, MRN:  [id number], DOB:  [birth year], LOS: 3 ADMISSION DATE:  07/01/2022, CONSULTATION DATE:  07/01/22 REFERRING MD:  TRH, CHIEF COMPLAINT:  AMS   History of Present Illness:  72 year old female with PMH significant for tobacco abuse, ?ETOH use, HTN, and COPD presenting to [facility] ER with altered mental status and hypoxia from airport (was going to visit her mother who is 100 in [place]).  Patient was found minimally responsive by flight attendants on a flight to [place] with pills in her lap.  EMS reported room air sats 86%.  Arrived responsive to noxious stimuli in ER on NRB.     Recently evaluated in [facility] ER 05/19/22 with multiple complaints of weakness, poor PO intake, LE edema, and recent pneumonia evaluated for possible GIB given Hgb 7.7.  Additionally found to have left lung nodule LLL 62m, and large pelvic mass thought to be possible teratoma.  She declined further GI evaluation/ EGD, echo, and was to follow up in outpatient.   Pt afebrile, HR NSR, SBP 90-110's, and requiring 4L [place].  ER workup notable for CO2 19, glucose 114, BUN/ sCr 42/ 1.06, albumin 3.2, trop hs neg 7> 8, normal lactic x 2, WBC 12, H/H 9.5/ 29.9, INR 1.4, neg SARS/ flu/ RSV, ETOH neg, UDS neg, CTH negative for acute intracranial process, CTA PE was neg for PE but showed patchy lower alveolar infiltrates, R> L suggestive of atelectasis vs pneumonia, with scattered ground glass densities in upper lung fields, and 11 mm pleural based LLL nodule.  Cultures sent and started on azithromycin and ceftriaxone, with 1.5L LR bolus.  Narcan given with no reported improvements.  Patient remains altered, minimally responsive at times then agitated and yelling at others, PCCM consulted for ICU admit.  ER RN reports pills found with patient were her norvasc, anoro, biotin, and vitamins, nothing of concern for overdose.   Pertinent  Medical History  Tobacco abuse, ETOH abuse, COPD, HTN, ?chronic pain  Significant  Hospital Events: Including procedures, antibiotic start and stop dates in addition to other pertinent events   3/6 admit with AMS, PNA, unclear metabolic acidosis  Interim History / Subjective:  Increased O2 requirement, lethargic when saturation dropped briefly to 60s    Objective   Blood pressure 125/74, pulse 81, temperature 97.9 F (36.6 C), temperature source Oral, resp. rate (!) 21, height '5\' 8"'$  (1.727 m), weight 53.1 kg, SpO2 98 %.    FiO2 (%):  [48 %] 48 %   Intake/Output Summary (Last 24 hours) at 07/04/2022 0837 Last data filed at 07/04/2022 0511 Gross per 24 hour  Intake 726.54 ml  Output 650 ml  Net 76.54 ml   Filed Weights   07/01/22 1741 07/02/22 0701 07/03/22 0711  Weight: 47.5 kg 47.8 kg 53.1 kg    Examination: General appearance: 72y.o., female, NAD, conversant, frail Eyes: tracking appropriately HENT: NCAT; MMM Lungs: rhonchi bl, with normal respiratory effort CV: RRR, no murmur  Abdomen: Soft, non-tender; non-distended, BS present  Extremities: 1-2+ peripheral edema, warm Skin: Normal turgor and texture; no rash Neuro:  grossly nonfocal   No new micro  CXR with increased   TTE with G1DD, plethoric IVC  CXR with bilateral pleural effusions, volume loss  Resolved Hospital Problem list     Assessment & Plan:   Acute hypoxic respiratory failure Septic shock due to CAP vs aspiration PNA, shock resolved COPD, not in exacerbation Pulmonary HTN (PAS 50 in 2018 on TTE) - echo  read as normal here  Bronchiectasis, mucus plugging Bilateral pleural effusions -start HHFNC wean O2 for sats 88-92% -continue brovana, pulmicort & yupelri  -guaifenesin, hypertonic nebs, chest vest (ordered), frequent flutter -complete course of ceftriaxone/azithromycin here and would transition to augmentin to complete 2 week course ABX in setting of her bronchiectasis -follow up legionella antigen  -effusions appear simple on bedside US - diurese today (ordered), may need to  redose depending on response. Thora vs pigtail if we're not mobilizing fluid with lasix, opening up lung with CPT -PT/mobilize  LLL lung nodule  Smoking If nodule is same as the one described in [facility] CTA 05/19/22, then there has been interval growth. Unable to pull up OSH CTA in canopy for comparison -will need to discuss at clinic visit - likely plan for 6-8 week repeat CT Super D Chest and clinic visit to discuss options including nav, pet, surveillance -smoking cessation recommended, patch here  Acute Metabolic Encephalopathy, resolved NAGMA AKI Protein calorie malnutrition Underweight, BMI 15.97.   Chronic normocytic anemia HTN Pelvic Mass - Per primary  Best Practice (right click and "Reselect all SmartList Selections" daily)  Diet/type: NPO DVT prophylaxis: prophylactic heparin  GI prophylaxis: N/A Lines: Central line and yes and it is still needed Foley:  N/A Code Status:  full code Last date of multidisciplinary goals of care discussion: full code but leaning toward DNR/Full.  Wants to discuss with brother.    Brother, Kaytlynne Basil (? In [place]) has not seen in her in 5 years, not able to provide much information.  Given contact of local friend by brother, Warrick Parisian [phone].   Critical care time: 32 minutes    Fredirick Maudlin Pulmonary/Critical Care  07/04/2022, 8:37 AM   Please see [email or web address] for pager details.   From 7A-7P if no response, please call [phone] After hours, please call ELink [phone]

## 2022-07-04 NOTE — Assessment & Plan Note (Signed)
Significant lethargy and minimal responsiveness that prompted the patient's presentation to [facility] is improving Symptoms felt to be mostly be secondary to infection

## 2022-07-04 NOTE — Evaluation (Signed)
Clinical/Bedside Swallow Evaluation Patient Details  Name: Marie Sloan MRN: [id number] Date of Birth: 09/30/1950  Today's Date: 07/04/2022 Time: SLP Start Time (ACUTE ONLY): 47 SLP Stop Time (ACUTE ONLY): 1442 SLP Time Calculation (min) (ACUTE ONLY): 10 min  Past Medical History:  Past Medical History:  Diagnosis Date   Alcohol abuse    COPD (chronic obstructive pulmonary disease) ([place])    Past Surgical History:  Past Surgical History:  Procedure Laterality Date   APPENDECTOMY     KNEE SURGERY     HPI:  Patient is a 72 y.o. female with PMH: tobacco abuse(up to 1ppd), ?ETOH use, HTN, COPD who presented to the ER with AMS and hypoxia from airport (was going to [place] to visit her mother who turned 60). She was found by flight attendents minimally responsive with pills on her lap. EMS reported RA sats at 86%; she arrived responsive to noxious stimuli in ER on NRB. She was recently evaluated at [facility] ER (05/19/22) with multiple complaints of poor PO intake, weakness, LE edema, recent PNA. She was found to have a left lung nodule LLL 59m and large pelvic mass thought to be possible teratoma. She declined urther GI evaluation/EGD/echo at that time. CT angio/chest/PE showed patchy alveolar infiltrates in both lower lung fields, more so on the right side suggesting atelectasis/pneumonia. There are scattered ground-glass densities in both upper lung fields suggesting scarring or pneumonia.    Assessment / Plan / Recommendation  Clinical Impression  Pt seen for skilled ST evaluation to determine safest diet recs following MD concerns of possible aspiration event. Pt is currently on a regular diet. The pt reports no difficulties with PO intake and no incidences of choking/coughing with meals. Pt was assessed with thin liquid, puree, and solids. [facility] the pt does have top and bottom dentures. The pt consumed all consistencies with no overt s/sx of aspiration. The pt did have some  prolonged mastication with solids, however no oral residue. The pt reports having some difficulties with mastication due to discomfort from ill fitting bottom dentures. SLP discussed softening solids to compensate for oral discomfort, pt verbally agreed to do so. Per pt request, diet recs at this time are dys 3 (mech soft)/ thin liquids adhering to compensatory aspiration precautions (sit upright with meals, small bites and sips, eat slowly). The pt's nurse and attending physician informed. SLP to follow up per POC for diet tolerance. SLP Visit Diagnosis: Dysphagia, oral phase (R13.11)    Aspiration Risk  Mild risk    Diet Recommendation Dysphagia 3 (Mech soft) (Per pt request for discomfort due to bottom dentures)   Medication Administration: Whole meds with liquid Supervision: Patient able to self feed Compensations: Slow rate;Small sips/bites Postural Changes: Seated upright at 90 degrees    Other  Recommendations      Recommendations for follow up therapy are one component of a multi-disciplinary discharge planning process, led by the attending physician.  Recommendations may be updated based on patient status, additional functional criteria and insurance authorization.  Follow up Recommendations No SLP follow up      Assistance Recommended at Discharge    Functional Status Assessment Patient has had a recent decline in their functional status and demonstrates the ability to make significant improvements in function in a reasonable and predictable amount of time.  Frequency and Duration min 1 x/week  1 week       Prognosis Prognosis for improved oropharyngeal function: Good  Swallow Study   General Date of Onset: 07/04/22 HPI: Patient is a 72 y.o. female with PMH: tobacco abuse(up to 1ppd), ?ETOH use, HTN, COPD who presented to the ER with AMS and hypoxia from airport (was going to [place] to visit her mother who turned 55). She was found by flight attendents minimally  responsive with pills on her lap. EMS reported RA sats at 86%; she arrived responsive to noxious stimuli in ER on NRB. She was recently evaluated at [facility] ER (05/19/22) with multiple complaints of poor PO intake, weakness, LE edema, recent PNA. She was found to have a left lung nodule LLL 36m and large pelvic mass thought to be possible teratoma. She declined urther GI evaluation/EGD/echo at that time. CT angio/chest/PE showed patchy alveolar infiltrates in both lower lung fields, more so on the right side suggesting atelectasis/pneumonia. There are scattered ground-glass densities in both upper lung fields suggesting scarring or pneumonia. Type of Study: Bedside Swallow Evaluation Previous Swallow Assessment: none found Diet Prior to this Study: NPO Temperature Spikes Noted: No Respiratory Status: Nasal cannula (High flow [place]) Behavior/Cognition: Alert;Cooperative;Pleasant mood Oral Cavity Assessment: Within Functional Limits Oral Care Completed by SLP: Yes Oral Cavity - Dentition: Dentures, top;Dentures, bottom Vision: Functional for self-feeding Self-Feeding Abilities: Able to feed self Patient Positioning: Upright in bed Baseline Vocal Quality: Low vocal intensity (functionally WFL) Volitional Cough: Strong    Oral/Motor/Sensory Function Overall Oral Motor/Sensory Function: Within functional limits   Ice Chips     Thin Liquid Thin Liquid: Within functional limits Presentation: Cup;Self Fed    Nectar Thick     Honey Thick     Puree Puree: Within functional limits Presentation: Self Fed   Solid     Solid: Within functional limits (prolonged mastication -- due to bottom dentures) Presentation: Self Fed      LCanary BrimM.S. CF-SLP

## 2022-07-04 NOTE — Assessment & Plan Note (Addendum)
Recent diagnosis in January 2024 at [facility] follow-up

## 2022-07-04 NOTE — Assessment & Plan Note (Signed)
No further clinical evidence of septic shock Discontinuing Levophed and midodrine Blood cultures thus far have been without growth

## 2022-07-04 NOTE — Progress Notes (Signed)
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  Patient presenting to [facility] emergency department via EMS from the local airport after being identified to be minimally responsive and lethargic by flight attendants prior to taking off on a [place] bound flight.  Upon evaluation in the emergency department patient was found to have bilateral lower lobe infiltrates concerning for pneumonia.  With clinical findings concerning for concurrent sepsis.  Patient was also found to be extremely agitated and was initiated on Precedex.  The PCCM service was called and patient was assessed for admission to the hospital.  During the patient's ICU course, patient developed septic shock.  Patient continued to receive intravenous antibiotics with ceftriaxone and azithromycin alongside levofloxacin.  In the days that followed patient's blood pressures did improve and patient was transitioned from Levophed to midodrine.  Patient was accepted to the hospitalist service the morning of 3/9.   Assessment and Plan: * Acute respiratory failure with hypoxia and hypercapnia (HCC) Patient still requiring significant amounts of supplemental oxygen via heated high flow oxygen delivery Respiratory failure felt to be secondary to bibasilar pneumonia. Additionally, repeat chest x-ray this morning revealing what appears to be pulmonary edema superimposed on known bibasilar pneumonia.  Providing patient with intravenous Lasix Continue to treat underlying pneumonia with intravenous  antibiotics with ceftriaxone and azithromycin Associated septic shock has resolved. 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 Providing patient with nicotine replacement therapy during this hospitalization.    Hypokalemia Persisting Replacing with potassium chloride Evaluating for concurrent hypomagnesemia  Monitoring potassium levels with serial chemistries.     Subjective:  Patient states that her weakness is improving.  Patient denies significant shortness of breath or chest pain.  Physical Exam:  Vitals:   07/04/22 1500 07/04/22 1545 07/04/22 1820 07/04/22 2048  BP: 135/65     Pulse: 79 72 70   Resp: (!) 22 17 (!) 29   Temp:  98.1 F (36.7 C)    TempSrc:  Axillary    SpO2: 96% 96% 93% 95%  Weight:      Height:        Constitutional: Awake alert and oriented x3, patient is cachectic. Skin: no rashes, no lesions, poor skin turgor noted. Eyes: Pupils are equally reactive to light.  No evidence of scleral icterus or conjunctival pallor.  ENMT: Moist mucous membranes noted.  Posterior pharynx clear of any exudate  or lesions.    Respiratory: Scattered Bilaterally with intermittent rales in the bilateral mid and lower fields and intermittent wheezing.  No evidence of accessory muscle use. Cardiovascular: Regular rate and rhythm, no murmurs / rubs / gallops. No extremity edema. 2+ pedal pulses. No carotid bruits.  Abdomen: Abdomen is soft and nontender.  No evidence of intra-abdominal masses.  Positive bowel sounds noted in all quadrants.   Musculoskeletal: No joint deformity upper and lower extremities. Good ROM, no contractures.  Extremely poor tone   Data Reviewed:  I have personally reviewed and interpreted labs, imaging.  Significant findings are   CBC: Recent Labs  Lab 07/01/22 0924 07/02/22 0331 07/03/22 0625 07/04/22 0301  WBC 12.0* 10.0 6.3 4.7  NEUTROABS 10.8*  --   --   --   HGB 9.5* 9.4* 8.4* 8.4*  HCT 29.9* 29.8* 26.0* 26.9*  MCV 93.7 94.9 94.2 96.4  PLT 487* 554* 448* [id number]   Basic Metabolic Panel: Recent Labs  Lab 07/01/22 0924 07/01/22 1249 07/02/22 0331 07/03/22 0625 07/04/22 0301  NA 137  --  139 136 134*  K 4.2  --  3.5 3.6 3.4*  CL 107  --  108 110 106  CO2 19*  --  22 21* 21*  GLUCOSE 114*  --  115* 103* 81  BUN 42*  --  27* 14 9  CREATININE 1.06*  --  0.68 0.54 0.54  CALCIUM 8.2*  --  8.0* 7.9* 7.8*  MG  --  2.0  --   --   --   PHOS  --  4.1  --   --   --    GFR: Estimated Creatinine Clearance: 54.9 mL/min (by C-G formula based on SCr of 0.54 mg/dL). Liver Function Tests: Recent Labs  Lab 07/01/22 0924  AST 32  ALT 16  ALKPHOS 59  BILITOT 0.7  PROT 7.3  ALBUMIN 3.2*    Coagulation Profile: Recent Labs  Lab 07/01/22 1610 07/02/22 0331  INR 1.4* 1.3*      Code Status:  Full code.  Code status decision has been confirmed with: patient    Severity of Illness:  The appropriate patient status for this patient is INPATIENT. Inpatient status is judged to be reasonable and necessary in order to provide the required intensity of service to ensure the  patient's safety. The patient's presenting symptoms, physical exam findings, and initial radiographic and laboratory data in the context of their chronic comorbidities is felt to place them at high risk for further clinical deterioration. Furthermore, it is not anticipated that the patient will be medically stable for discharge from the hospital within 2 midnights of admission.   * I certify that at the point of admission it is my clinical judgment that the patient will require inpatient hospital care spanning beyond 2 midnights from the point of admission due to high intensity of service, high risk for further deterioration and high frequency of surveillance required.*  Time spent:  56 minutes  Author:  Vernelle Emerald MD  07/04/2022 9:27 PM

## 2022-07-04 NOTE — Assessment & Plan Note (Signed)
Poor oral intake with evidence of muscle wasting and cachexia on exam and BMI of 17.8. Nutrition consult, their input is appreciated Initiating Ensure supplements twice daily inbetween meals Daily multivitamin

## 2022-07-05 ENCOUNTER — Inpatient Hospital Stay (HOSPITAL_COMMUNITY): Payer: Medicare HMO

## 2022-07-05 DIAGNOSIS — I5033 Acute on chronic diastolic (congestive) heart failure: Secondary | ICD-10-CM | POA: Diagnosis not present

## 2022-07-05 DIAGNOSIS — J9602 Acute respiratory failure with hypercapnia: Secondary | ICD-10-CM | POA: Diagnosis not present

## 2022-07-05 DIAGNOSIS — J9601 Acute respiratory failure with hypoxia: Secondary | ICD-10-CM | POA: Diagnosis not present

## 2022-07-05 DIAGNOSIS — G9341 Metabolic encephalopathy: Secondary | ICD-10-CM | POA: Diagnosis not present

## 2022-07-05 DIAGNOSIS — J189 Pneumonia, unspecified organism: Secondary | ICD-10-CM | POA: Diagnosis not present

## 2022-07-05 LAB — CBC WITH DIFFERENTIAL/PLATELET
Abs Immature Granulocytes: 0.03 10*3/uL (ref 0.00–0.07)
Basophils Absolute: 0 10*3/uL (ref 0.0–0.1)
Basophils Relative: 1 %
Eosinophils Absolute: 0.1 10*3/uL (ref 0.0–0.5)
Eosinophils Relative: 2 %
HCT: 27.9 % — ABNORMAL LOW (ref 36.0–46.0)
Hemoglobin: 9 g/dL — ABNORMAL LOW (ref 12.0–15.0)
Immature Granulocytes: 1 %
Lymphocytes Relative: 4 %
Lymphs Abs: 0.3 10*3/uL — ABNORMAL LOW (ref 0.7–4.0)
MCH: 30.3 pg (ref 26.0–34.0)
MCHC: 32.3 g/dL (ref 30.0–36.0)
MCV: 93.9 fL (ref 80.0–100.0)
Monocytes Absolute: 0.4 10*3/uL (ref 0.1–1.0)
Monocytes Relative: 6 %
Neutro Abs: 5.4 10*3/uL (ref 1.7–7.7)
Neutrophils Relative %: 86 %
Platelets: 418 10*3/uL — ABNORMAL HIGH (ref 150–400)
RBC: 2.97 MIL/uL — ABNORMAL LOW (ref 3.87–5.11)
RDW: 21.1 % — ABNORMAL HIGH (ref 11.5–15.5)
WBC: 6.3 10*3/uL (ref 4.0–10.5)
nRBC: 0 % (ref 0.0–0.2)

## 2022-07-05 LAB — MAGNESIUM: Magnesium: 1.5 mg/dL — ABNORMAL LOW (ref 1.7–2.4)

## 2022-07-05 LAB — GLUCOSE, CAPILLARY
Glucose-Capillary: 105 mg/dL — ABNORMAL HIGH (ref 70–99)
Glucose-Capillary: 105 mg/dL — ABNORMAL HIGH (ref 70–99)
Glucose-Capillary: 161 mg/dL — ABNORMAL HIGH (ref 70–99)
Glucose-Capillary: 94 mg/dL (ref 70–99)
Glucose-Capillary: 99 mg/dL (ref 70–99)

## 2022-07-05 LAB — COMPREHENSIVE METABOLIC PANEL
ALT: 17 U/L (ref 0–44)
AST: 25 U/L (ref 15–41)
Albumin: 2.7 g/dL — ABNORMAL LOW (ref 3.5–5.0)
Alkaline Phosphatase: 65 U/L (ref 38–126)
Anion gap: 11 (ref 5–15)
BUN: 6 mg/dL — ABNORMAL LOW (ref 8–23)
CO2: 27 mmol/L (ref 22–32)
Calcium: 8.5 mg/dL — ABNORMAL LOW (ref 8.9–10.3)
Chloride: 102 mmol/L (ref 98–111)
Creatinine, Ser: 0.49 mg/dL (ref 0.44–1.00)
GFR, Estimated: >60 mL/min (ref >60)
Glucose, Bld: 87 mg/dL (ref 70–99)
Potassium: 3.7 mmol/L (ref 3.5–5.1)
Sodium: 140 mmol/L (ref 135–145)
Total Bilirubin: 0.4 mg/dL (ref 0.3–1.2)
Total Protein: 5.8 g/dL — ABNORMAL LOW (ref 6.5–8.1)

## 2022-07-05 LAB — BRAIN NATRIURETIC PEPTIDE: B Natriuretic Peptide: 330.3 pg/mL — ABNORMAL HIGH (ref 0.0–100.0)

## 2022-07-05 MED ORDER — ENSURE ENLIVE PO LIQD: 237.0000 mL | Freq: Two times a day (BID) | ORAL | Status: DC

## 2022-07-05 MED ORDER — FUROSEMIDE 10 MG/ML IJ SOLN
40.0000 mg | Freq: Two times a day (BID) | INTRAMUSCULAR | Status: AC
  Administered 2022-07-05 (×2): 40 mg via INTRAVENOUS
  Filled 2022-07-05 (×2): qty 4

## 2022-07-05 MED ORDER — PROSIGHT PO TABS
1.0000 | ORAL_TABLET | Freq: Every day | ORAL | Status: DC
  Administered 2022-07-05 – 2022-07-06 (×2): 1 via ORAL
  Filled 2022-07-05: qty 1

## 2022-07-05 MED ORDER — MAGNESIUM SULFATE 4 GM/100ML IV SOLN
4.0000 g | Freq: Once | INTRAVENOUS | Status: AC
  Administered 2022-07-05: 4 g via INTRAVENOUS
  Filled 2022-07-05: qty 100

## 2022-07-05 NOTE — Progress Notes (Addendum)
NAME:  Marie Sloan, MRN:  [id number], DOB:  1950/11/15, LOS: 4 ADMISSION DATE:  07/01/2022, CONSULTATION DATE:  07/01/22 REFERRING MD:  TRH, CHIEF COMPLAINT:  AMS   History of Present Illness:  72 year old female with PMH significant for tobacco abuse, ?ETOH use, HTN, and COPD presenting to [facility] ER with altered mental status and hypoxia from airport (was going to visit her mother who is 100 in [place]).  Patient was found minimally responsive by flight attendants on a flight to [place] with pills in her lap.  EMS reported room air sats 86%.  Arrived responsive to noxious stimuli in ER on NRB.     Recently evaluated in [facility] ER 05/19/22 with multiple complaints of weakness, poor PO intake, LE edema, and recent pneumonia evaluated for possible GIB given Hgb 7.7.  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  She declined further GI evaluation/ EGD, echo, and was to follow up in outpatient.   Pt afebrile, HR NSR, SBP 90-110's, and requiring 4L .  ER workup notable for CO2 19, glucose 114, BUN/ sCr 42/ 1.06, albumin 3.2, trop hs neg 7> 8, normal lactic x 2, WBC 12, H/H 9.5/ 29.9, INR 1.4, neg SARS/ flu/ RSV, ETOH neg, UDS neg, CTH negative for acute intracranial process, CTA PE was neg for PE but showed patchy lower alveolar infiltrates, R> L suggestive of atelectasis vs pneumonia, with scattered ground glass densities in upper lung fields, and 11 mm pleural based LLL nodule.  Cultures sent and started on azithromycin and ceftriaxone, with 1.5L LR bolus.  Narcan given with no reported improvements.  Patient remains altered, minimally responsive at times then agitated and yelling at others, PCCM consulted for ICU admit.  ER RN reports pills found with patient were her norvasc, anoro, biotin, and vitamins, nothing of concern for overdose.   Pertinent  Medical History  Tobacco abuse, ETOH abuse, COPD, HTN, ?chronic pain  Significant  Hospital Events: Including procedures, antibiotic start and stop dates in addition to other pertinent events   3/6 admit with AMS, PNA, unclear metabolic acidosis  Interim History / Subjective:  Good response to diuretic  Objective   Blood pressure (!) 124/99, pulse 91, temperature 98.1 F (36.7 C), temperature source Oral, resp. rate (!) 26, height '5\' 8"'$  (1.727 m), weight 53.1 kg, SpO2 99 %.    FiO2 (%):  [35 %-60 %] 40 %   Intake/Output Summary (Last 24 hours) at 07/05/2022 0806 Last data filed at 07/04/2022 1800 Gross per 24 hour  Intake 546.8 ml  Output 1200 ml  Net -653.2 ml   Filed Weights   07/01/22 1741 07/02/22 0701 07/03/22 0711  Weight: 47.5 kg 47.8 kg 53.1 kg    Examination: General appearance: 72y.o., female, NAD, conversant, frail Eyes: tracking appropriately HENT: NCAT; MMM Lungs: rhonchi bl, with normal respiratory effort CV: RRR, no murmur  Abdomen: Soft, non-tender; non-distended, BS present  Extremities: 1-2+ peripheral edema, warm Skin: Normal turgor and texture; no rash Neuro:  grossly nonfocal   No new micro  CXR with decreased size of bilateral pleural effusions   Resolved Hospital Problem list     Assessment & Plan:   Acute hypoxic respiratory failure Septic shock due to CAP vs aspiration PNA, shock resolved COPD, not in exacerbation Pulmonary HTN (PAS 50 in 2018 on TTE) - echo read as normal here  Bronchiectasis, mucus plugging Bilateral pleural effusions -HHFNC prn WOB, otherwise  trial salter and wean O2 for sats 88-92% -continue brovana, pulmicort & yupelri while here -guaifenesin, hypertonic nebs, chest vest (ordered), frequent flutter -complete course of ceftriaxone/azithromycin here and would transition to augmentin to complete 2 week course ABX in setting of her bronchiectasis -follow up legionella antigen  -effusions still appear simple on bedside US. No clinical suspicion for empyema nor malignant effusion given absence of  effusions on presentation - diurese today (ordered), may need to redose depending on response -PT/mobilize  LLL lung nodule  Smoking If nodule is same as the one described in [facility] CTA 05/19/22, then there has been interval growth. Unable to pull up OSH CTA in canopy for comparison -will need to discuss at clinic visit - likely plan for 6-8 week repeat CT Super D Chest and clinic visit to discuss options including nav, pet, surveillance -smoking cessation recommended, patch here  Acute Metabolic Encephalopathy, resolved NAGMA AKI Protein calorie malnutrition Underweight, BMI 15.97.   Chronic normocytic anemia HTN Pelvic Mass - Per primary  Best Practice (right click and "Reselect all SmartList Selections" daily)  Diet/type: NPO DVT prophylaxis: prophylactic heparin  GI prophylaxis: N/A Lines: Central line and yes and it is still needed Foley:  N/A Code Status:  full code Last date of multidisciplinary goals of care discussion: full code but leaning toward DNR/Full.  Wants to discuss with brother.    Brother, Idalys Convery in [place] has not seen in her in 5 years, not able to provide much information.  Given contact of local friend by brother, Adelina Mings [phone].   Critical care time: na    [facility]  07/05/2022, 8:06 AM   Please see [email or web address] for pager details.   From 7A-7P if no response, please call [phone] After hours, please call ELink [phone]

## 2022-07-05 NOTE — Assessment & Plan Note (Addendum)
Evidence of diastolic dysfunction on echocardiogram with elevated BNP Development of pulmonary edema and pleural effusions after aggressive intravenous volume resuscitation earlier in the hospitalization resulting in acute cardiogenic volume overload Slowly improving  managing with intravenous Lasix as noted above.

## 2022-07-05 NOTE — Progress Notes (Signed)
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  Patient presenting to [facility] emergency department via EMS from the local airport after being identified to be minimally responsive and lethargic by flight attendants prior to taking off on a [place] bound flight.  Upon evaluation in the emergency department patient was found to have bilateral lower lobe infiltrates concerning for pneumonia.  With clinical findings concerning for concurrent sepsis.  Patient was also found to be extremely agitated and was initiated on Precedex.  The PCCM service was called and patient was assessed for admission to the hospital.  During the patient's ICU course, patient developed septic shock.  Patient continued to receive intravenous antibiotics with ceftriaxone and azithromycin alongside levofloxacin.  In the days that followed patient's blood pressures did improve and patient was transitioned from Levophed to midodrine.  Patient was accepted to the hospitalist service the morning of 3/9.   Assessment and Plan: * Acute respiratory failure with hypoxia and hypercapnia (HCC) Patient still requiring significant amounts of supplemental oxygen via heated high flow oxygen delivery Multifactorial respiratory failure felt to be secondary to bibasilar pneumonia with imposed acute pulmonary edema and bilateral pleural effusions Repeat chest ray today reveals improving pleural effusions.   If pleural effusions fail to rapidly improve with diuresis will consider thoracentesis   providing patient with intravenous Lasix Continue to treat underlying pneumonia with intravenous antibiotics with ceftriaxone and azithromycin Associated septic shock has resolved. Continuing to follow cultures   Pneumonia of both lower lobes due to infectious organism Treating pneumonia as noted above  Acute on chronic diastolic CHF (congestive heart failure) (HCC) Evidence of diastolic dysfunction on echocardiogram with elevated BNP Development of pulmonary edema and pleural effusions after aggressive intravenous volume resuscitation suggestive of acute cardiogenic volume overload Managing with intravenous Lasix as noted above.  Acute metabolic encephalopathy Mentation seems to have improved to baseline. Symptoms felt to be mostly be secondary to infection  Septic shock (HCC) No further clinical evidence of septic shock Discontinuing Levophed and midodrine Blood cultures thus far have been without growth  Protein-calorie malnutrition, severe (HCC) Poor oral intake with evidence of muscle wasting and cachexia on exam and BMI of 17.8. 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 Outpatient follow-up  Left lower lobe pulmonary nodule Recently identified during January 2024 hospitalization at [facility] follow-up  Nicotine dependence, cigarettes, uncomplicated Patient is being counseled daily on smoking cessation. Providing patient with nicotine replacement therapy during this hospitalization.    Hypokalemia Persisting Replacing with potassium chloride Evaluating for concurrent hypomagnesemia  Monitoring potassium levels with serial chemistries.    Subjective:  Patient states that her weakness continues  to proved.  Patient is still experiencing shortness of breath albeit improved.  Physical Exam:  Vitals:   07/05/22 1700 07/05/22 1800 07/05/22 1901 07/05/22 2022  BP: 139/69 (!) 145/76 132/73  Pulse: 84 (!) 147 80   Resp: 19 16 (!) 23   Temp:   97.8 F (36.6 C)   TempSrc:   Oral   SpO2: 97% 100% 97% 97%  Weight:      Height:        Constitutional: Awake alert and oriented x3, patient is cachectic. Skin: Diffuse ecchymosis of the bilateral upper extremities., poor skin turgor noted. Eyes: Pupils are equally reactive to light.  No evidence of scleral icterus or conjunctival pallor.  ENMT: Moist mucous membranes noted.  Posterior pharynx clear of any exudate or lesions.   Respiratory: Scattered Bilaterally with intermittent rales in the bilateral mid and lower fields.  Wheezing has improved.  No evidence of accessory muscle use. Cardiovascular: Regular rate and rhythm, no murmurs / rubs / gallops. No extremity edema. 2+ pedal pulses. No carotid bruits.  Abdomen: Abdomen is soft and nontender.  No evidence of intra-abdominal masses.  Positive bowel sounds noted in all quadrants.   Musculoskeletal: No joint deformity upper and lower extremities. Good ROM, no contractures.  Extremely poor tone   Data Reviewed:  I have personally reviewed and interpreted labs, imaging.  Significant findings are   CBC: Recent Labs  Lab 07/01/22 0924 07/02/22 0331 07/03/22 0625 07/04/22 0301 07/05/22 0331  WBC 12.0* 10.0 6.3 4.7 6.3  NEUTROABS 10.8*  --   --   --  5.4  HGB 9.5* 9.4* 8.4* 8.4* 9.0*  HCT 29.9* 29.8* 26.0* 26.9* 27.9*  MCV 93.7 94.9 94.2 96.4 93.9  PLT 487* 554* 448* 362 [id number]*   Basic Metabolic Panel: Recent Labs  Lab 07/01/22 0924 07/01/22 1249 07/02/22 0331 07/03/22 0625 07/04/22 0301 07/05/22 0331  NA 137  --  139 136 134* 140  K 4.2  --  3.5 3.6 3.4* 3.7  CL 107  --  108 110 106 102  CO2 19*  --  22 21* 21* 27  GLUCOSE 114*  --  115* 103* 81 87  BUN 42*  --  27*  14 9 6*  CREATININE 1.06*  --  0.68 0.54 0.54 0.49  CALCIUM 8.2*  --  8.0* 7.9* 7.8* 8.5*  MG  --  2.0  --   --   --  1.5*  PHOS  --  4.1  --   --   --   --    GFR: Estimated Creatinine Clearance: 54.9 mL/min (by C-G formula based on SCr of 0.49 mg/dL). Liver Function Tests: Recent Labs  Lab 07/01/22 0924 07/05/22 0331  AST 32 25  ALT 16 17  ALKPHOS 59 65  BILITOT 0.7 0.4  PROT 7.3 5.8*  ALBUMIN 3.2* 2.7*    Coagulation Profile: Recent Labs  Lab 07/01/22 1610 07/02/22 0331  INR 1.4* 1.3*      Code Status:  Full code.  Code status decision has been confirmed with: patient    Severity of Illness:  The appropriate patient status for this patient is INPATIENT. Inpatient status is judged to be reasonable and necessary in order to provide the required intensity of service to ensure the patient's safety. The patient's presenting symptoms, physical exam findings, and initial radiographic and laboratory data in the context of their chronic comorbidities is felt to place them at high risk for further clinical deterioration. Furthermore, it is not anticipated that the patient will be medically stable for discharge from the hospital within 2 midnights of admission.   * I certify that at the point of admission it is my clinical  judgment that the patient will require inpatient hospital care spanning beyond 2 midnights from the point of admission due to high intensity of service, high risk for further deterioration and high frequency of surveillance required.*  Time spent:  52 minutes  Author:  Vernelle Emerald MD  07/05/2022 8:36 PM

## 2022-07-05 NOTE — Progress Notes (Signed)
Pt refused cpt at this time doesnot want to do any of her therapies until after breakfast. Breakfast had not yet arrived.

## 2022-07-06 ENCOUNTER — Inpatient Hospital Stay (HOSPITAL_COMMUNITY): Payer: Medicare HMO

## 2022-07-06 DIAGNOSIS — I5033 Acute on chronic diastolic (congestive) heart failure: Secondary | ICD-10-CM | POA: Diagnosis not present

## 2022-07-06 DIAGNOSIS — G9341 Metabolic encephalopathy: Secondary | ICD-10-CM | POA: Diagnosis not present

## 2022-07-06 DIAGNOSIS — J9601 Acute respiratory failure with hypoxia: Secondary | ICD-10-CM | POA: Diagnosis not present

## 2022-07-06 DIAGNOSIS — J9602 Acute respiratory failure with hypercapnia: Secondary | ICD-10-CM | POA: Diagnosis not present

## 2022-07-06 DIAGNOSIS — J189 Pneumonia, unspecified organism: Secondary | ICD-10-CM | POA: Diagnosis not present

## 2022-07-06 LAB — COMPREHENSIVE METABOLIC PANEL
ALT: 22 U/L (ref 0–44)
AST: 43 U/L — ABNORMAL HIGH (ref 15–41)
Albumin: 2.7 g/dL — ABNORMAL LOW (ref 3.5–5.0)
Alkaline Phosphatase: 67 U/L (ref 38–126)
Anion gap: 6 (ref 5–15)
BUN: <5 mg/dL — ABNORMAL LOW (ref 8–23)
CO2: 32 mmol/L (ref 22–32)
Calcium: 7.9 mg/dL — ABNORMAL LOW (ref 8.9–10.3)
Chloride: 96 mmol/L — ABNORMAL LOW (ref 98–111)
Creatinine, Ser: 0.43 mg/dL — ABNORMAL LOW (ref 0.44–1.00)
GFR, Estimated: >60 mL/min (ref >60)
Glucose, Bld: 95 mg/dL (ref 70–99)
Potassium: 3 mmol/L — ABNORMAL LOW (ref 3.5–5.1)
Sodium: 134 mmol/L — ABNORMAL LOW (ref 135–145)
Total Bilirubin: 0.4 mg/dL (ref 0.3–1.2)
Total Protein: 5.9 g/dL — ABNORMAL LOW (ref 6.5–8.1)

## 2022-07-06 LAB — CBC WITH DIFFERENTIAL/PLATELET
Abs Immature Granulocytes: 0.02 10*3/uL (ref 0.00–0.07)
Basophils Absolute: 0 10*3/uL (ref 0.0–0.1)
Basophils Relative: 0 %
Eosinophils Absolute: 0.1 10*3/uL (ref 0.0–0.5)
Eosinophils Relative: 2 %
HCT: 27.9 % — ABNORMAL LOW (ref 36.0–46.0)
Hemoglobin: 8.8 g/dL — ABNORMAL LOW (ref 12.0–15.0)
Immature Granulocytes: 0 %
Lymphocytes Relative: 7 %
Lymphs Abs: 0.4 10*3/uL — ABNORMAL LOW (ref 0.7–4.0)
MCH: 29.7 pg (ref 26.0–34.0)
MCHC: 31.5 g/dL (ref 30.0–36.0)
MCV: 94.3 fL (ref 80.0–100.0)
Monocytes Absolute: 0.3 10*3/uL (ref 0.1–1.0)
Monocytes Relative: 6 %
Neutro Abs: 4.5 10*3/uL (ref 1.7–7.7)
Neutrophils Relative %: 85 %
Platelets: 388 10*3/uL (ref 150–400)
RBC: 2.96 MIL/uL — ABNORMAL LOW (ref 3.87–5.11)
RDW: 20.4 % — ABNORMAL HIGH (ref 11.5–15.5)
WBC: 5.3 10*3/uL (ref 4.0–10.5)
nRBC: 0 % (ref 0.0–0.2)

## 2022-07-06 LAB — CULTURE, BLOOD (ROUTINE X 2)
Culture: NO GROWTH
Culture: NO GROWTH
Special Requests: ADEQUATE
Special Requests: ADEQUATE

## 2022-07-06 LAB — GLUCOSE, CAPILLARY
Glucose-Capillary: 100 mg/dL — ABNORMAL HIGH (ref 70–99)
Glucose-Capillary: 110 mg/dL — ABNORMAL HIGH (ref 70–99)
Glucose-Capillary: 116 mg/dL — ABNORMAL HIGH (ref 70–99)
Glucose-Capillary: 89 mg/dL (ref 70–99)
Glucose-Capillary: 95 mg/dL (ref 70–99)

## 2022-07-06 LAB — MAGNESIUM: Magnesium: 1.8 mg/dL (ref 1.7–2.4)

## 2022-07-06 LAB — LEGIONELLA PNEUMOPHILA SEROGP 1 UR AG: L. pneumophila Serogp 1 Ur Ag: NEGATIVE

## 2022-07-06 MED ORDER — FUROSEMIDE 10 MG/ML IJ SOLN
40.0000 mg | Freq: Two times a day (BID) | INTRAMUSCULAR | Status: AC
  Administered 2022-07-06 – 2022-07-07 (×2): 40 mg via INTRAVENOUS
  Filled 2022-07-06 (×2): qty 4

## 2022-07-06 MED ORDER — POTASSIUM CHLORIDE CRYS ER 20 MEQ PO TBCR
40.0000 meq | EXTENDED_RELEASE_TABLET | ORAL | Status: AC
  Administered 2022-07-06 (×2): 40 meq via ORAL
  Filled 2022-07-06 (×2): qty 2

## 2022-07-06 MED ORDER — JUVEN PO PACK
1.0000 | PACK | Freq: Two times a day (BID) | ORAL | Status: DC
  Administered 2022-07-06 – 2022-07-09 (×7): 1 via ORAL
  Filled 2022-07-06 (×7): qty 1

## 2022-07-06 MED ORDER — ENSURE ENLIVE PO LIQD
237.0000 mL | Freq: Three times a day (TID) | ORAL | Status: DC
  Administered 2022-07-06 – 2022-07-07 (×3): 237 mL via ORAL

## 2022-07-06 MED ORDER — ADULT MULTIVITAMIN W/MINERALS CH
1.0000 | ORAL_TABLET | Freq: Every day | ORAL | Status: DC
  Administered 2022-07-06 – 2022-07-09 (×4): 1 via ORAL
  Filled 2022-07-06 (×4): qty 1

## 2022-07-06 NOTE — Progress Notes (Addendum)
Nutrition Follow-up  DOCUMENTATION CODES:   Severe malnutrition in context of chronic illness  INTERVENTION:   -Consider adding bowel regimen if no BM in next 24 hours  -Needs updated weight. Daily weights are ordered. On IV Lasix  Monitor magnesium, potassium, and phosphorus for at least 3 days, MD to replete as needed, as pt is at risk for refeeding syndrome.   -Ensure Plus High Protein po TID, each supplement provides 350 kcal and 20 grams of protein.   -1 packet Juven BID, each packet provides 95 calories, 2.5 grams of protein (collagen), and 9.8 grams of carbohydrate (3 grams sugar); also contains 7 grams of L-arginine and L-glutamine, 300 mg vitamin C, 15 mg vitamin E, 1.2 mcg vitamin B-12, 9.5 mg zinc, 200 mg calcium, and 1.5 g  Calcium Beta-hydroxy-Beta-methylbutyrate to support wound healing   -Multivitamin with minerals daily  -Checking additional vitamin labs given poor PO and alcohol abuse: Vitamin A, Zinc, Vitamin C  -Reached out to SLP regarding pt's desire for liberalized diet  NEW NUTRITION DIAGNOSIS:   Severe Malnutrition related to chronic illness (substance abuse) as evidenced by severe fat depletion, severe muscle depletion.  GOAL:   Patient will meet greater than or equal to 90% of their needs  Progressing.  MONITOR:   PO intake, Supplement acceptance, Labs, Weight trends, I & O's, Skin  REASON FOR ASSESSMENT:   Consult Assessment of nutrition requirement/status  ASSESSMENT:   71yF with history of etoh use (unconfirmed recent use), COPD, recently discovered large pelvic teratoma who was found to be decreasingly responsive as she was boarding a plane on her way to her mom's 100th birthday party.      Here she was responsive to noxious stim and would become intermittently agitated with stimulation. She has mild metabolic acidosis and secondary respiratory acidosis.  Patient in room, eating breakfast. States she made herself eat all of her eggs, ate a  few bites of potatoes. Mental status has improved and was able to answer RD's questions. Pt states she has had poor appetite recently and endorses a lot of weight loss. Pt states she has tried to drink Premier Protein to help gain weight back but this didn't help. Willing to try higher kcal supplement Ensure in strawberry or chocolate flavor. Denies any issues with swallowing, mainly some previous chewing difficulty r/t ill-fitting dentures. Pt states she does not like being on mechanical soft diet and wished to have regular diet and she can cut up her own meats. Sent message to SLP who is to see pt today. Diet was later liberalized to regular.  Pt states her diet has consisted of tacos, pizza, chicken. Takes Flintstones vitamins, iron and Biotin r/t hair loss over the past year. Will check additional vitamins in the AM given hair loss, poor quality diet and ecchymosis  of BUEs.  Admission weight: 105 lbs Last weight 3/8: 117 lbs Need updated weight as pt is currently being diuresed. Daily weights are ordered.  Medications: IV folic acid, Lasix, KLOR-CON, Multivitamin with minerals daily, Thiamine  NUTRITION - FOCUSED PHYSICAL EXAM:  Flowsheet Row Most Recent Value  Orbital Region Moderate depletion  Upper Arm Region Severe depletion  [ecchymosis]  Thoracic and Lumbar Region Severe depletion  Buccal Region Severe depletion  Temple Region Severe depletion  Clavicle Bone Region Severe depletion  Clavicle and Acromion Bone Region Severe depletion  Scapular Bone Region Severe depletion  Dorsal Hand Severe depletion  Patellar Region Severe depletion  Anterior Thigh Region Severe depletion  Posterior  Calf Region Severe depletion  Edema (RD Assessment) None  Hair Reviewed  [thin, reports hair loss over past year]  Eyes Reviewed  Mouth Reviewed  [poor dentition, uses dentures-ill-fitting]  Skin Reviewed  [ecchymosis, dry flaky]  Nails Reviewed       Diet Order:   Diet Order              Diet regular Room service appropriate? Yes; Fluid consistency: Thin  Diet effective now                   EDUCATION NEEDS:   No education needs have been identified at this time  Skin:  Skin Assessment: Skin Integrity Issues: Skin Integrity Issues:: Stage II Stage II: left buttocks  Last BM:  PTA  Height:   Ht Readings from Last 1 Encounters:  07/01/22 '5\' 8"'$  (1.727 m)    Weight:   Wt Readings from Last 1 Encounters:  07/03/22 53.1 kg    BMI:  Body mass index is 17.8 kg/m.  Estimated Nutritional Needs:   Kcal:  1900-2100  Protein:  95-105g  Fluid:  2.1L/day  Clayton Bibles, MS, RD, LDN Inpatient Clinical Dietitian Contact information available via Amion

## 2022-07-06 NOTE — Progress Notes (Signed)
RT note: CPT via Bed not given for 2000 due to >'d acuity/admissions.

## 2022-07-06 NOTE — Progress Notes (Signed)
Medication sent to pharmacy, cigarettes/lighter sent to security.

## 2022-07-06 NOTE — Progress Notes (Signed)
Cigarettes taken to security.   Personal meds to be take to pharmacy.

## 2022-07-06 NOTE — Progress Notes (Signed)
Patient found attempting to hide purse, under her arm.   Primary RN witnessed her pulling personal medication from purse. Medication was given to primary RN.   Two packets of cigarettes and a lighter were also taken.    Patient is aware of our policy and verbalizes understanding.

## 2022-07-06 NOTE — Progress Notes (Addendum)
NAME:  Marie Sloan, MRN:  [id number], DOB:  09-Dec-1950, LOS: 4 ADMISSION DATE:  07/01/2022, CONSULTATION DATE:  07/01/22 REFERRING MD:  TRH, CHIEF COMPLAINT:  AMS   History of Present Illness:  72 year old female with PMH significant for tobacco abuse, ?ETOH use, HTN, and COPD presenting to [facility] ER with altered mental status and hypoxia from airport (was going to visit her mother who is 100 in [place]).  Patient was found minimally responsive by flight attendants on a flight to [place] with pills in her lap.  EMS reported room air sats 86%.  Arrived responsive to noxious stimuli in ER on NRB.     Recently evaluated in [facility] ER 05/19/22 with multiple complaints of weakness, poor PO intake, LE edema, and recent pneumonia evaluated for possible GIB given Hgb 7.7.  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  She declined further GI evaluation/ EGD, echo, and was to follow up in outpatient.   Pt afebrile, HR NSR, SBP 90-110's, and requiring 4L [place].  ER workup notable for CO2 19, glucose 114, BUN/ sCr 42/ 1.06, albumin 3.2, trop hs neg 7> 8, normal lactic x 2, WBC 12, H/H 9.5/ 29.9, INR 1.4, neg SARS/ flu/ RSV, ETOH neg, UDS neg, CTH negative for acute intracranial process, CTA PE was neg for PE but showed patchy lower alveolar infiltrates, R> L suggestive of atelectasis vs pneumonia, with scattered ground glass densities in upper lung fields, and 11 mm pleural based LLL nodule.  Cultures sent and started on azithromycin and ceftriaxone, with 1.5L LR bolus.  Narcan given with no reported improvements.  Patient remains altered, minimally responsive at times then agitated and yelling at others, PCCM consulted for ICU admit.  ER RN reports pills found with patient were her norvasc, anoro, biotin, and vitamins, nothing of concern for overdose.   Pertinent  Medical History  Tobacco abuse, ETOH abuse, COPD, HTN, ?chronic pain  Significant  Hospital Events: Including procedures, antibiotic start and stop dates in addition to other pertinent events   3/6 admit with AMS, PNA, unclear metabolic acidosis  Interim History / Subjective:  No distress. Feeling better currently on 30% heated high flow   Objective   Blood pressure (!) 124/99, pulse 91, temperature 98.1 F (36.7 C), temperature source Oral, resp. rate (!) 26, height '5\' 8"'$  (1.727 m), weight 53.1 kg, SpO2 99 %.    FiO2 (%):  [35 %-60 %] 40 %   Intake/Output Summary (Last 24 hours) at 07/05/2022 0806 Last data filed at 07/04/2022 1800 Gross per 24 hour  Intake 546.8 ml  Output 1200 ml  Net -653.2 ml   Filed Weights   07/01/22 1741 07/02/22 0701 07/03/22 0711  Weight: 47.5 kg 47.8 kg 53.1 kg    Examination: General 72year old female sitting up in bed. No distress HENT NCAT no JVD.  Pulm decreased on right. No current accessory use. PCXR personally reviewed: R>L basilar airspace disease. W/ improved areation when c/w prior film Card rrr Abd soft Ext warm and dry  Neuro intact    Resolved Hospital Problem list   Septic shock due to CAP vs aspiration PNA, shock resolved Acute Metabolic Encephalopathy, resolved NAGMA AKI Assessment & Plan:   Acute hypoxic respiratory failure 2/2 PNA  COPD, not in exacerbation Pulmonary HTN (PAS 50 in 2018 on TTE)  Bronchiectasis, mucus plugging.  Bilateral pleural effusions Protein calorie malnutrition Underweight, BMI 15.97.  Chronic normocytic anemia HTN Pelvic Mass  Pulm problem list  Acute hypoxic respiratory failure 2/2 PNA superimposed on COPD, not in exacerbation, Pulmonary HTN (PAS 50 in 2018 on TTE) and c/b Bronchiectasis, mucus plugging. Bilateral pleural effusions Tolerating diuresis  Plan Clinically room to come down on O2 support Cont brovana, yupelri and pulmicort Try to wean to salter from [facility] today  Cont HT Na nebs Cont chest PT Completing total of 2 weeks augmentin Repeat lasix today.   Repeat CXR and bedside US tomorrow. Might consider thoracentesis    LLL lung nodule  Smoking If nodule is same as the one described in [facility] CTA 05/19/22, then there has been interval growth. Unable to pull up OSH CTA in canopy for comparison Plan will need to discuss at clinic visit - likely plan for 6-8 week repeat CT Super D Chest and clinic visit to discuss options including nav, pet, surveillance smoking cessation recommended, patch here   Best Practice (right click and "Reselect all SmartList Selections" daily)  Diet/type: NPO DVT prophylaxis: prophylactic heparin  GI prophylaxis: N/A Lines: Central line and yes and it is still needed Foley:  N/A Code Status:  full code Last date of multidisciplinary goals of care discussion: full code but leaning toward DNR/Full.  Wants to discuss with brother.     Critical care time: na    Erick Colace ACNP-BC [place] Pager # [phone] OR # [phone] if no answer

## 2022-07-06 NOTE — Progress Notes (Signed)
   07/06/22 1423  Oxygen Therapy/Pulse Ox  O2 Device (S)  Nasal Cannula (Changed to 2 L [place].)  O2 Therapy Oxygen humidified (refilled H20 bottle.)  O2 Flow Rate (L/min) 2 L/min  FiO2 (%) 28 %  SpO2 96 %  Oxygen On/Off  Continued

## 2022-07-06 NOTE — Progress Notes (Signed)
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  Patient presenting to [facility] emergency department via EMS from the local airport after being identified to be minimally responsive and lethargic by flight attendants prior to taking off on a [place] bound flight.  Upon evaluation in the emergency department patient was found to have bilateral lower lobe infiltrates concerning for pneumonia.  With clinical findings concerning for concurrent sepsis.  Patient was also found to be extremely agitated and was initiated on Precedex.  The PCCM service was called and patient was assessed for admission to the hospital.  During the patient's ICU course, patient developed septic shock.  Patient continued to receive intravenous antibiotics with ceftriaxone and azithromycin alongside levofloxacin.  In the days that followed patient's blood pressures did improve and patient was transitioned from Levophed to midodrine.  Patient was accepted to the hospitalist service the morning of 3/9.   Assessment and Plan: * Acute respiratory failure with hypoxia and hypercapnia ([facility]) Patient has been weaned to salter high flow cannula this morning Multifactorial respiratory failure felt to be secondary to bibasilar pneumonia with imposed acute pulmonary edema and bilateral pleural effusions Repeat chest ray today reveals continued slow improvement in pleural effusions likely secondary to degree of acute diastolic congestive heart failure. Pulmonary has ordered an additional 2 doses of  intravenous Lasix Continue to treat underlying pneumonia with intravenous antibiotics with ceftriaxone and azithromycin Associated septic shock has resolved. Continuing to follow cultures   Pneumonia of both lower lobes due to infectious organism Treating pneumonia as noted above  Acute on chronic diastolic CHF (congestive heart failure) (HCC) Evidence of diastolic dysfunction on echocardiogram with elevated BNP Development of pulmonary edema and pleural effusions after aggressive intravenous volume resuscitation suggestive of acute cardiogenic volume overload Slowly improving  managing with intravenous Lasix as noted above.  Acute metabolic encephalopathy Mentation seems to have improved to baseline. Symptoms felt to be mostly be secondary to infection  Septic shock (HCC) No further clinical evidence of septic shock Discontinuing Levophed and midodrine Blood cultures thus far have been without growth  Protein-calorie malnutrition, severe (HCC) Poor oral intake with evidence of muscle wasting and cachexia on exam and BMI of 17.8. 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 Outpatient follow-up  Left lower lobe pulmonary nodule Recently identified during January 2024 hospitalization at [place] follow-up  Nicotine dependence, cigarettes, uncomplicated Patient is being counseled daily on smoking cessation. Providing patient with nicotine replacement therapy during this hospitalization.    Hypokalemia Persisting Replacing with potassium chloride Evaluating for concurrent  hypomagnesemia  Monitoring potassium levels with serial chemistries.    Subjective:  Patient states that her weakness continues to improve however patient is not yet at baseline.  Patient states  that her shortness of breath is nearly resolved.  Patient still complains of cough but this is nonproductive.  Physical Exam:  Vitals:   07/06/22 1625 07/06/22 1700 07/06/22 1800 07/06/22 2000  BP:    129/88  Pulse:  86 92 80  Resp:  (!) 24 (!) 25 20  Temp:    98.5 F (36.9 C)  TempSrc:    Oral  SpO2: 96% 98% 97% 97%  Weight:      Height:        Constitutional: Awake alert and oriented x3, patient is cachectic. Skin: Diffuse ecchymosis of the bilateral upper extremities., poor skin turgor noted. Eyes: Pupils are equally reactive to light.  No evidence of scleral icterus or conjunctival pallor.  ENMT: Moist mucous membranes noted.  Posterior pharynx clear of any exudate or lesions.   Respiratory: Scattered Bilaterally with intermittent rales in the bilateral mid and lower fields.  Diminished breath sounds at the bases.  Wheezing has resolved.  No evidence of accessory muscle use. Cardiovascular: Regular rate and rhythm, no murmurs / rubs / gallops. No extremity edema. 2+ pedal pulses. No carotid bruits.  Abdomen: Abdomen is soft and nontender.  Lower abdominal mass palpated that is nontender.  Positive bowel sounds noted in all quadrants.   Musculoskeletal: No joint deformity upper and lower extremities. Good ROM, no contractures.  Extremely poor tone   Data Reviewed:  I have personally reviewed and interpreted labs, imaging.  Significant findings are   CBC: Recent Labs  Lab 07/01/22 0924 07/02/22 0331 07/03/22 0625 07/04/22 0301 07/05/22 0331 07/06/22 0321  WBC 12.0* 10.0 6.3 4.7 6.3 5.3  NEUTROABS 10.8*  --   --   --  5.4 4.5  HGB 9.5* 9.4* 8.4* 8.4* 9.0* 8.8*  HCT 29.9* 29.8* 26.0* 26.9* 27.9* 27.9*  MCV 93.7 94.9 94.2 96.4 93.9 94.3  PLT 487* 554* 448* 362 418* [id number]    Basic Metabolic Panel: Recent Labs  Lab 07/01/22 1249 07/02/22 0331 07/03/22 0625 07/04/22 0301 07/05/22 0331 07/06/22 0321  NA  --  139 136 134* 140 134*  K  --  3.5 3.6 3.4* 3.7 3.0*  CL  --  108 110 106 102 96*  CO2  --  22 21* 21* 27 32  GLUCOSE  --  115* 103* 81 87 95  BUN  --  27* 14 9 6* <5*  CREATININE  --  0.68 0.54 0.54 0.49 0.43*  CALCIUM  --  8.0* 7.9* 7.8* 8.5* 7.9*  MG 2.0  --   --   --  1.5* 1.8  PHOS 4.1  --   --   --   --   --    GFR: Estimated Creatinine Clearance: 54.9 mL/min (A) (by C-G formula based on SCr of 0.43 mg/dL (L)). Liver Function Tests: Recent Labs  Lab 07/01/22 0924 07/05/22 0331 07/06/22 0321  AST 32 25 43*  ALT '16 17 22  '$ ALKPHOS 59 65 67  BILITOT 0.7 0.4 0.4  PROT 7.3 5.8* 5.9*  ALBUMIN 3.2* 2.7* 2.7*    Coagulation Profile: Recent Labs  Lab 07/01/22 1610 07/02/22 0331  INR 1.4* 1.3*      Code Status:  Full code.  Code status decision has been confirmed with: patient    Severity of Illness:  The appropriate patient status for this patient is INPATIENT. Inpatient status is judged to be reasonable and necessary in order to provide the required intensity of service to ensure the patient's safety. The patient's presenting symptoms,  physical exam findings, and initial radiographic and laboratory data in the context of their chronic comorbidities is felt to place them at high risk for further clinical deterioration. Furthermore, it is not anticipated that the patient will be medically stable for discharge from the hospital within 2 midnights of admission.   * I certify that at the point of admission it is my clinical judgment that the patient will require inpatient hospital care spanning beyond 2 midnights from the point of admission due to high intensity of service, high risk for further deterioration and high frequency of surveillance required.*  Time spent:  55 minutes  Author:  Vernelle Emerald MD  07/06/2022 10:47  PM

## 2022-07-06 NOTE — Progress Notes (Signed)
Speech Language Pathology:    Patient Details Name: Marie Sloan MRN: [id number] DOB: 29-Jul-1950 Today's Date: 07/06/2022   SLP informed by RD that patient was requesting her diet be advanced to regular solids. SLP reviewed chart and spoke briefly with patient who reported she was no long having any mouth pain or difficulty with chewing and she could cut food up if needed. No reports of patient having any difficulties with PO intake. Of note, her supplemental oxygen has been decreased and she is currently on 2L (was on 30L when SLP saw her on Saturday 3/11). As recommendation of diet texture for mechanical soft solids was based on patient's request at the time, SLP in agreement with patient to upgrade to regular texture solids. SLP to s/o at this time but please reorder if needed.   Sonia Baller, MA, CCC-SLP Speech Therapy

## 2022-07-06 NOTE — Evaluation (Signed)
Physical Therapy Evaluation Patient Details Name: Marie Sloan MRN: [id number] DOB: Nov 06, 1950 Today's Date: 07/06/2022  History of Present Illness  72 year old female with past medical history of alcohol use, nicotine dependence, chronic pain syndrome and recent diagnosis of pelvic mass.Patient presenting to [facility] emergency department 07/01/22 via EMS from the local airport after being identified to be minimally responsive and lethargic by flight attendants. Patient found to have bilateral lower lobe infiltrates concerning for pneumonia, with clinical findings concerning for concurrent sepsis.  Clinical Impression  Pt admitted with above diagnosis.  Pt currently with functional limitations due to the deficits listed below (see PT Problem List). Pt will benefit from skilled PT to increase their independence and safety with mobility to allow discharge to the venue listed below.     The patient ambulated 20' in room using RWon RA, briefly dropped SPO2 from 96% o 2 L to 87%, replaced 2 LPM with return to 90% within 1 minute.  Continue progressive mobility .  Patient independent PTA, reports no  local family/friends for support.      Recommendations for follow up therapy are one component of a multi-disciplinary discharge planning process, led by the attending physician.  Recommendations may be updated based on patient status, additional functional criteria and insurance authorization.  Follow Up Recommendations Home health PT      Assistance Recommended at Discharge PRN  Patient can return home with the following  A little help with walking and/or transfers;Help with stairs or ramp for entrance;Assist for transportation;Assistance with cooking/housework    Equipment Recommendations None recommended by PT  Recommendations for Other Services       Functional Status Assessment Patient has had a recent decline in their functional status and demonstrates the ability to make  significant improvements in function in a reasonable and predictable amount of time.     Precautions / Restrictions Precautions Precautions: Fall Precaution Comments: monitor Sats      Mobility  Bed Mobility Overal bed mobility: Independent                  Transfers Overall transfer level: Needs assistance Equipment used: Rolling walker (2 wheels) Transfers: Sit to/from Stand Sit to Stand: Min guard           General transfer comment: close  guard    Ambulation/Gait Ambulation/Gait assistance: Min assist Gait Distance (Feet): 20 Feet Assistive device: Rolling walker (2 wheels) Gait Pattern/deviations: Step-through pattern, Drifts right/left Gait velocity: decr     General Gait Details: steady ssistance for ambulation/ turns  Science writer    Modified Rankin (Stroke Patients Only)       Balance Overall balance assessment: Mild deficits observed, not formally tested                                           Pertinent Vitals/Pain Pain Assessment Pain Assessment: No/denies pain    Home Living Family/patient expects to be discharged to:: Private residence Living Arrangements: Alone   Type of Home: House Home Access: Level entry       Home Layout: One level Home Equipment: None      Prior Function Prior Level of Function : Independent/Modified Independent;Driving                     Hand Dominance  Extremity/Trunk Assessment   Upper Extremity Assessment Upper Extremity Assessment: Overall WFL for tasks assessed    Lower Extremity Assessment Lower Extremity Assessment: Generalized weakness    Cervical / Trunk Assessment Cervical / Trunk Assessment: Normal  Communication   Communication: No difficulties  Cognition Arousal/Alertness: Awake/alert Behavior During Therapy: WFL for tasks assessed/performed, Flat affect Overall Cognitive Status: Within Functional Limits  for tasks assessed                                          General Comments      Exercises     Assessment/Plan    PT Assessment Patient needs continued PT services  PT Problem List Decreased strength;Decreased mobility;Decreased activity tolerance;Decreased balance;Decreased knowledge of use of DME;Decreased knowledge of precautions       PT Treatment Interventions DME instruction;Therapeutic activities;Gait training;Therapeutic exercise;Patient/family education;Functional mobility training    PT Goals (Current goals can be found in the Care Plan section)  Acute Rehab PT Goals Patient Stated Goal: go home PT Goal Formulation: With patient Time For Goal Achievement: 07/20/22 Potential to Achieve Goals: Good    Frequency Min 3X/week     Co-evaluation               AM-PAC PT "6 Clicks" Mobility  Outcome Measure Help needed turning from your back to your side while in a flat bed without using bedrails?: None Help needed moving from lying on your back to sitting on the side of a flat bed without using bedrails?: None Help needed moving to and from a bed to a chair (including a wheelchair)?: A Little Help needed standing up from a chair using your arms (e.g., wheelchair or bedside chair)?: A Little Help needed to walk in hospital room?: A Lot Help needed climbing 3-5 steps with a railing? : A Lot 6 Click Score: 18    End of Session   Activity Tolerance: Patient tolerated treatment well;Patient limited by fatigue Patient left: in chair;with call bell/phone within reach;with nursing/sitter in room Nurse Communication: Mobility status PT Visit Diagnosis: Unsteadiness on feet (R26.81);Muscle weakness (generalized) (M62.81);Difficulty in walking, not elsewhere classified (R26.2)    Time: [id number] PT Time Calculation (min) (ACUTE ONLY): 26 min   Charges:   PT Evaluation $PT Eval Low Complexity: 1 Low PT Treatments $Gait Training: 8-22 mins         [place] Office [phone] Weekend [id number]   Claretha Cooper 07/06/2022, 4:19 PM

## 2022-07-07 ENCOUNTER — Other Ambulatory Visit: Payer: Self-pay

## 2022-07-07 DIAGNOSIS — I5033 Acute on chronic diastolic (congestive) heart failure: Secondary | ICD-10-CM | POA: Diagnosis not present

## 2022-07-07 DIAGNOSIS — J9601 Acute respiratory failure with hypoxia: Secondary | ICD-10-CM | POA: Diagnosis not present

## 2022-07-07 DIAGNOSIS — G9341 Metabolic encephalopathy: Secondary | ICD-10-CM | POA: Diagnosis not present

## 2022-07-07 DIAGNOSIS — J189 Pneumonia, unspecified organism: Secondary | ICD-10-CM | POA: Diagnosis not present

## 2022-07-07 LAB — CBC WITH DIFFERENTIAL/PLATELET
Abs Immature Granulocytes: 0.02 10*3/uL (ref 0.00–0.07)
Basophils Absolute: 0 10*3/uL (ref 0.0–0.1)
Basophils Relative: 1 %
Eosinophils Absolute: 0 10*3/uL (ref 0.0–0.5)
Eosinophils Relative: 1 %
HCT: 28 % — ABNORMAL LOW (ref 36.0–46.0)
Hemoglobin: 8.9 g/dL — ABNORMAL LOW (ref 12.0–15.0)
Immature Granulocytes: 0 %
Lymphocytes Relative: 13 %
Lymphs Abs: 0.7 10*3/uL (ref 0.7–4.0)
MCH: 30.2 pg (ref 26.0–34.0)
MCHC: 31.8 g/dL (ref 30.0–36.0)
MCV: 94.9 fL (ref 80.0–100.0)
Monocytes Absolute: 0.4 10*3/uL (ref 0.1–1.0)
Monocytes Relative: 8 %
Neutro Abs: 3.8 10*3/uL (ref 1.7–7.7)
Neutrophils Relative %: 77 %
Platelets: 371 10*3/uL (ref 150–400)
RBC: 2.95 MIL/uL — ABNORMAL LOW (ref 3.87–5.11)
RDW: 19.9 % — ABNORMAL HIGH (ref 11.5–15.5)
WBC: 4.9 10*3/uL (ref 4.0–10.5)
nRBC: 0 % (ref 0.0–0.2)

## 2022-07-07 LAB — COMPREHENSIVE METABOLIC PANEL
ALT: 25 U/L (ref 0–44)
AST: 37 U/L (ref 15–41)
Albumin: 2.8 g/dL — ABNORMAL LOW (ref 3.5–5.0)
Alkaline Phosphatase: 66 U/L (ref 38–126)
Anion gap: 8 (ref 5–15)
BUN: 12 mg/dL (ref 8–23)
CO2: 36 mmol/L — ABNORMAL HIGH (ref 22–32)
Calcium: 8.4 mg/dL — ABNORMAL LOW (ref 8.9–10.3)
Chloride: 91 mmol/L — ABNORMAL LOW (ref 98–111)
Creatinine, Ser: 0.5 mg/dL (ref 0.44–1.00)
GFR, Estimated: >60 mL/min (ref >60)
Glucose, Bld: 86 mg/dL (ref 70–99)
Potassium: 3.8 mmol/L (ref 3.5–5.1)
Sodium: 135 mmol/L (ref 135–145)
Total Bilirubin: 0.4 mg/dL (ref 0.3–1.2)
Total Protein: 5.8 g/dL — ABNORMAL LOW (ref 6.5–8.1)

## 2022-07-07 LAB — GLUCOSE, CAPILLARY
Glucose-Capillary: 104 mg/dL — ABNORMAL HIGH (ref 70–99)
Glucose-Capillary: 124 mg/dL — ABNORMAL HIGH (ref 70–99)
Glucose-Capillary: 129 mg/dL — ABNORMAL HIGH (ref 70–99)
Glucose-Capillary: 65 mg/dL — ABNORMAL LOW (ref 70–99)
Glucose-Capillary: 82 mg/dL (ref 70–99)
Glucose-Capillary: 86 mg/dL (ref 70–99)

## 2022-07-07 LAB — MAGNESIUM: Magnesium: 1.8 mg/dL (ref 1.7–2.4)

## 2022-07-07 LAB — PHOSPHORUS: Phosphorus: 1.8 mg/dL — ABNORMAL LOW (ref 2.5–4.6)

## 2022-07-07 LAB — BRAIN NATRIURETIC PEPTIDE: B Natriuretic Peptide: 150 pg/mL — ABNORMAL HIGH (ref 0.0–100.0)

## 2022-07-07 MED ORDER — FOLIC ACID 1 MG PO TABS
1.0000 mg | ORAL_TABLET | Freq: Every day | ORAL | Status: DC
  Administered 2022-07-07 – 2022-07-09 (×3): 1 mg via ORAL
  Filled 2022-07-07 (×3): qty 1

## 2022-07-07 MED ORDER — THIAMINE MONONITRATE 100 MG PO TABS
100.0000 mg | ORAL_TABLET | Freq: Every day | ORAL | Status: DC
  Administered 2022-07-07 – 2022-07-09 (×3): 100 mg via ORAL
  Filled 2022-07-07 (×3): qty 1

## 2022-07-07 MED ORDER — K PHOS MONO-SOD PHOS DI & MONO 155-852-130 MG PO TABS
500.0000 mg | ORAL_TABLET | Freq: Two times a day (BID) | ORAL | Status: AC
  Administered 2022-07-07 (×2): 500 mg via ORAL
  Filled 2022-07-07 (×2): qty 2

## 2022-07-07 MED ORDER — FUROSEMIDE 10 MG/ML IJ SOLN: 40.0000 mg | Freq: Once | INTRAMUSCULAR | Status: DC

## 2022-07-07 MED ORDER — FUROSEMIDE 10 MG/ML IJ SOLN
40.0000 mg | Freq: Two times a day (BID) | INTRAMUSCULAR | Status: DC
  Administered 2022-07-07: 40 mg via INTRAVENOUS
  Filled 2022-07-07: qty 4

## 2022-07-07 NOTE — Evaluation (Signed)
Occupational Therapy Evaluation Patient Details Name: Marie Sloan MRN: [id number] DOB: 1951/02/01 Today's Date: 07/07/2022   History of Present Illness Patient is a 72 year old female with past medical history of alcohol use, nicotine dependence, chronic pain syndrome and recent diagnosis of pelvic mass.Patient presenting to [facility] emergency department 07/01/22 via EMS [place] the local airport after being identified to be minimally responsive and lethargic by flight attendants. Patient found to have bilateral lower lobe infiltrates concerning for pneumonia, with clinical findings concerning for concurrent sepsis.   Clinical Impression   Patient is a 72 year old female who was admitted for above. Patient  was independent prior level. Currently, patient is min guard for standing balance with instability noted but no overt loss of balance with furniture walking. Patient was noted to have poor standing balance, decreased functional activity tolerance, decreased strength impacting participation in ADLs. Patient would continue to benefit from skilled OT services at this time while admitted and after d/c to address noted deficits in order to improve overall safety and independence in ADLs.       Recommendations for follow up therapy are one component of a multi-disciplinary discharge planning process, led by the attending physician.  Recommendations may be updated based on patient status, additional functional criteria and insurance authorization.   Follow Up Recommendations  Skilled nursing-short term rehab (<3 hours/day)     Assistance Recommended at Discharge Frequent or constant Supervision/Assistance  Patient can return home with the following Assistance with cooking/housework;Direct supervision/assist for medications management;Assist for transportation;Help with stairs or ramp for entrance;Direct supervision/assist for financial management;A little help with  bathing/dressing/bathroom    Functional Status Assessment  Patient has had a recent decline in their functional status and demonstrates the ability to make significant improvements in function in a reasonable and predictable amount of time.  Equipment Recommendations  None recommended by OT       Precautions / Restrictions Precautions Precautions: Fall Precaution Comments: monitor Sats Restrictions Weight Bearing Restrictions: No      Mobility Bed Mobility Overal bed mobility: Independent        Transfers Overall transfer level: Needs assistance   Transfers: Sit to/from Stand Sit to Stand: Min guard       General transfer comment: close  guard      Balance Overall balance assessment: Mild deficits observed, not formally tested         ADL either performed or assessed with clinical judgement   ADL Overall ADL's : Needs assistance/impaired Eating/Feeding: Modified independent;Sitting   Grooming: Set up;Sitting   Upper Body Bathing: Set up;Sitting   Lower Body Bathing: Min guard;Sit to/from stand   Upper Body Dressing : Set up;Sitting   Lower Body Dressing: Min guard;Sit to/from stand Lower Body Dressing Details (indicate cue type and reason): notably unsteady with standing and reaching out to hold onto objects to maintain balance with one UE while pulling up mesh underwear. Toilet Transfer: Min guard;Ambulation Toilet Transfer Details (indicate cue type and reason): declined to use RW with patient furniture walking in room. unsteady with no overt loss of balance. Toileting- Water quality scientist and Hygiene: Min guard;Sit to/from stand                Pertinent Vitals/Pain Pain Assessment Pain Assessment: No/denies pain     Hand Dominance Right   Extremity/Trunk Assessment Upper Extremity Assessment Upper Extremity Assessment: Overall WFL for tasks assessed   Lower Extremity Assessment Lower Extremity Assessment: Defer to PT evaluation    Cervical /  Trunk Assessment Cervical / Trunk Assessment: Normal   Communication Communication Communication: No difficulties   Cognition Arousal/Alertness: Awake/alert Behavior During Therapy: WFL for tasks assessed/performed, Flat affect Overall Cognitive Status: Within Functional Limits for tasks assessed                    Home Living Family/patient expects to be discharged to:: Private residence Living Arrangements: Alone   Type of Home: House Home Access: Level entry     Home Layout: One level     Bathroom Shower/Tub: Tub/shower unit         Home Equipment: None          Prior Functioning/Environment Prior Level of Function : Independent/Modified Independent;Driving              OT Problem List: Decreased activity tolerance;Impaired balance (sitting and/or standing);Decreased coordination;Decreased safety awareness;Decreased knowledge of precautions;Decreased knowledge of use of DME or AE      OT Treatment/Interventions: Self-care/ADL training;Energy conservation;Therapeutic exercise;DME and/or AE instruction;Therapeutic activities;Patient/family education;Balance training    OT Goals(Current goals can be found in the care plan section) Acute Rehab OT Goals Patient Stated Goal: to go home OT Goal Formulation: With patient Time For Goal Achievement: 07/21/22 Potential to Achieve Goals: Fair  OT Frequency: Min 2X/week       AM-PAC OT "6 Clicks" Daily Activity     Outcome Measure Help from another person eating meals?: None Help from another person taking care of personal grooming?: A Little Help from another person toileting, which includes using toliet, bedpan, or urinal?: A Little Help from another person bathing (including washing, rinsing, drying)?: A Little Help from another person to put on and taking off regular upper body clothing?: A Little Help from another person to put on and taking off regular lower body clothing?: A Little 6 Click  Score: 19   End of Session Equipment Utilized During Treatment: Gait belt;Rolling walker (2 wheels) Nurse Communication: Mobility status  Activity Tolerance: Patient tolerated treatment well Patient left: in chair;with call bell/phone within reach  OT Visit Diagnosis: Unsteadiness on feet (R26.81);Other abnormalities of gait and mobility (R26.89);Muscle weakness (generalized) (M62.81)                Time: 1042-1100 OT Time Calculation (min): 18 min Charges:  OT General Charges $OT Visit: 1 Visit OT Evaluation $OT Eval Moderate Complexity: 1 Mod  [place] OTR/L, MS Acute Rehabilitation Department Office# [phone]   Willa Rough 07/07/2022, 1:23 PM

## 2022-07-07 NOTE — Progress Notes (Signed)
Hypoglycemic Event  CBG: 65  Treatment: 4 oz juice/soda  Symptoms: None  Follow-up CBG: Time:0523 CBG Result:82  Possible Reasons for Event: Inadequate meal intake  Comments/MD notified:Hypoglycemia treatment protocol followed    Jackquline Berlin

## 2022-07-07 NOTE — Progress Notes (Signed)
Physical Therapy Treatment Patient Details Name: Marie Sloan MRN: [id number] DOB: 1950/11/29 Today's Date: 07/07/2022   History of Present Illness Patient is a 72 year old female with past medical history of alcohol use, nicotine dependence, chronic pain syndrome and recent diagnosis of pelvic mass.Patient presenting to [facility] emergency department 07/01/22 via [place] local airport after being identified to be minimally responsive and lethargic by flight attendants. Patient found to have bilateral lower lobe infiltrates concerning for pneumonia, with clinical findings concerning for concurrent sepsis.    PT Comments    Pt is progressing well with activity tolerance. She ambulated 41' without an assistive device, min/guard assist for safety, no overt loss of balance, SpO2 91-92% on room air while walking, SpO2 96% on room air at rest.     Recommendations for follow up therapy are one component of a multi-disciplinary discharge planning process, led by the attending physician.  Recommendations may be updated based on patient status, additional functional criteria and insurance authorization.  Follow Up Recommendations  Home health PT     Assistance Recommended at Discharge PRN  Patient can return home with the following Assist for transportation;Help with stairs or ramp for entrance;Assistance with cooking/housework   Equipment Recommendations  None recommended by PT    Recommendations for Other Services       Precautions / Restrictions Precautions Precautions: Fall Precaution Comments: monitor Sats Restrictions Weight Bearing Restrictions: No     Mobility  Bed Mobility Overal bed mobility: Independent                  Transfers Overall transfer level: Needs assistance   Transfers: Sit to/from Stand Sit to Stand: Min guard           General transfer comment: close  guard, mildly unsteady upon standing    Ambulation/Gait Ambulation/Gait  assistance: Min guard Gait Distance (Feet): 90 Feet Assistive device: None Gait Pattern/deviations: Step-through pattern, Drifts right/left Gait velocity: decr     General Gait Details: min/guard for safety, some drifting but no loss of balance; SpO2 91% on room air walking   Stairs             Wheelchair Mobility    Modified Rankin (Stroke Patients Only)       Balance                                            Cognition Arousal/Alertness: Awake/alert Behavior During Therapy: WFL for tasks assessed/performed, Flat affect Overall Cognitive Status: Within Functional Limits for tasks assessed                                          Exercises      General Comments        Pertinent Vitals/Pain Pain Assessment Pain Assessment: No/denies pain    Home Living Family/patient expects to be discharged to:: Private residence Living Arrangements: Alone   Type of Home: House Home Access: Level entry       Home Layout: One level Home Equipment: None      Prior Function            PT Goals (current goals can now be found in the care plan section) Acute Rehab PT Goals Patient Stated Goal: go home PT Goal  Formulation: With patient Time For Goal Achievement: 07/20/22 Potential to Achieve Goals: Good Progress towards PT goals: Progressing toward goals    Frequency           PT Plan Current plan remains appropriate    Co-evaluation              AM-PAC PT "6 Clicks" Mobility   Outcome Measure  Help needed turning from your back to your side while in a flat bed without using bedrails?: None Help needed moving from lying on your back to sitting on the side of a flat bed without using bedrails?: None Help needed moving to and from a bed to a chair (including a wheelchair)?: A Little Help needed standing up from a chair using your arms (e.g., wheelchair or bedside chair)?: A Little Help needed to walk in  hospital room?: A Little Help needed climbing 3-5 steps with a railing? : A Little 6 Click Score: 20    End of Session   Activity Tolerance: Patient tolerated treatment well Patient left: in bed;with call bell/phone within reach;with bed alarm set Nurse Communication: Mobility status PT Visit Diagnosis: Unsteadiness on feet (R26.81);Muscle weakness (generalized) (M62.81);Difficulty in walking, not elsewhere classified (R26.2)     Time: [id number] PT Time Calculation (min) (ACUTE ONLY): 16 min  Charges:  $Gait Training: 8-22 mins                     Blondell Reveal Kistler PT 07/07/2022  Acute Rehabilitation Services  Office [phone]

## 2022-07-07 NOTE — Care Management Important Message (Signed)
Important Message  Patient Details  Name: Marie Sloan MRN: [id number] Date of Birth: Jun 10, 1950   Medicare Important Message Given:  Yes     Memory Argue 07/07/2022, 12:35 PM

## 2022-07-08 ENCOUNTER — Inpatient Hospital Stay (HOSPITAL_COMMUNITY): Payer: Medicare HMO

## 2022-07-08 DIAGNOSIS — J189 Pneumonia, unspecified organism: Secondary | ICD-10-CM

## 2022-07-08 LAB — CBC WITH DIFFERENTIAL/PLATELET
Abs Immature Granulocytes: 0.02 10*3/uL (ref 0.00–0.07)
Basophils Absolute: 0 10*3/uL (ref 0.0–0.1)
Basophils Relative: 1 %
Eosinophils Absolute: 0.1 10*3/uL (ref 0.0–0.5)
Eosinophils Relative: 1 %
HCT: 28.1 % — ABNORMAL LOW (ref 36.0–46.0)
Hemoglobin: 9 g/dL — ABNORMAL LOW (ref 12.0–15.0)
Immature Granulocytes: 0 %
Lymphocytes Relative: 21 %
Lymphs Abs: 1.2 10*3/uL (ref 0.7–4.0)
MCH: 30.5 pg (ref 26.0–34.0)
MCHC: 32 g/dL (ref 30.0–36.0)
MCV: 95.3 fL (ref 80.0–100.0)
Monocytes Absolute: 0.4 10*3/uL (ref 0.1–1.0)
Monocytes Relative: 8 %
Neutro Abs: 3.8 10*3/uL (ref 1.7–7.7)
Neutrophils Relative %: 69 %
Platelets: 346 10*3/uL (ref 150–400)
RBC: 2.95 MIL/uL — ABNORMAL LOW (ref 3.87–5.11)
RDW: 19.2 % — ABNORMAL HIGH (ref 11.5–15.5)
WBC: 5.5 10*3/uL (ref 4.0–10.5)
nRBC: 0 % (ref 0.0–0.2)

## 2022-07-08 LAB — COMPREHENSIVE METABOLIC PANEL
ALT: 26 U/L (ref 0–44)
AST: 35 U/L (ref 15–41)
Albumin: 3 g/dL — ABNORMAL LOW (ref 3.5–5.0)
Alkaline Phosphatase: 78 U/L (ref 38–126)
Anion gap: 11 (ref 5–15)
BUN: 21 mg/dL (ref 8–23)
CO2: 38 mmol/L — ABNORMAL HIGH (ref 22–32)
Calcium: 8.5 mg/dL — ABNORMAL LOW (ref 8.9–10.3)
Chloride: 87 mmol/L — ABNORMAL LOW (ref 98–111)
Creatinine, Ser: 0.46 mg/dL (ref 0.44–1.00)
GFR, Estimated: >60 mL/min (ref >60)
Glucose, Bld: 101 mg/dL — ABNORMAL HIGH (ref 70–99)
Potassium: 3.2 mmol/L — ABNORMAL LOW (ref 3.5–5.1)
Sodium: 136 mmol/L (ref 135–145)
Total Bilirubin: 0.4 mg/dL (ref 0.3–1.2)
Total Protein: 5.7 g/dL — ABNORMAL LOW (ref 6.5–8.1)

## 2022-07-08 LAB — GLUCOSE, CAPILLARY
Glucose-Capillary: 99 mg/dL (ref 70–99)
Glucose-Capillary: 93 mg/dL (ref 70–99)
Glucose-Capillary: 155 mg/dL — ABNORMAL HIGH (ref 70–99)
Glucose-Capillary: 112 mg/dL — ABNORMAL HIGH (ref 70–99)
Glucose-Capillary: 140 mg/dL — ABNORMAL HIGH (ref 70–99)

## 2022-07-08 LAB — MAGNESIUM: Magnesium: 1.8 mg/dL (ref 1.7–2.4)

## 2022-07-08 LAB — PHOSPHORUS: Phosphorus: 3.8 mg/dL (ref 2.5–4.6)

## 2022-07-08 MED ORDER — FUROSEMIDE 10 MG/ML IJ SOLN
40.0000 mg | Freq: Once | INTRAMUSCULAR | Status: AC
  Administered 2022-07-08: 40 mg via INTRAVENOUS
  Filled 2022-07-08: qty 4

## 2022-07-08 MED ORDER — POTASSIUM CHLORIDE CRYS ER 10 MEQ PO TBCR
40.0000 meq | EXTENDED_RELEASE_TABLET | ORAL | Status: AC
  Administered 2022-07-08 (×2): 40 meq via ORAL
  Filled 2022-07-08 (×2): qty 4

## 2022-07-08 MED ORDER — MAGNESIUM SULFATE 2 GM/50ML IV SOLN
2.0000 g | Freq: Once | INTRAVENOUS | Status: AC
  Administered 2022-07-08: 2 g via INTRAVENOUS
  Filled 2022-07-08: qty 50

## 2022-07-08 NOTE — Plan of Care (Signed)

## 2022-07-08 NOTE — Progress Notes (Signed)
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  Patient presenting to [facility] emergency department via EMS from the local airport after being identified to be minimally responsive and lethargic by flight attendants prior to taking off on a [place] bound flight.  Upon evaluation in the emergency department patient was found to have bilateral lower lobe infiltrates concerning for pneumonia.  With clinical findings concerning for concurrent sepsis.  Patient was also found to be extremely agitated and was initiated on Precedex.  The PCCM service was called and patient was assessed for admission to the hospital.  During the patient's ICU course, patient developed septic shock.  Patient continued to receive intravenous antibiotics with ceftriaxone and azithromycin.  In the days that followed patient's blood pressures did improve and patient was transitioned from Levophed to midodrine and ultimately discontinued altogether.  Patient was accepted to the hospitalist service the morning of 3/9.  In the latter half of the patient's hospitalization, patient had a recurrent surgeons of acute hypoxic respiratory failure with chest imaging revealing pulmonary edema and worsening bilateral pleural effusions.  Echocardiogram revealed diastolic congestive heart failure and it was felt the patient was suffering from acute cardiogenic volume overload that developed after aggressive volume resuscitation earlier in the hospitalization.  Patient was temporarily placed on heated high  flow oxygen delivery and patient was aggressively diuresed with intravenous Lasix over the next several days.   Assessment and Plan: * Acute respiratory failure with hypoxia and hypercapnia (HCC) Continuing to wean supplemental oxygen, currently on low rate nasal cannula Multifactorial respiratory failure felt to be secondary to bibasilar pneumonia with imposed acute pulmonary edema and bilateral pleural effusions I believe patient may still have some residual pleural effusion.  Will administer an additional 2 doses of intravenous Lasix throughout the day and repeat chest x-ray tomorrow. Continue to treat underlying pneumonia with intravenous antibiotics with ceftriaxone and azithromycin, will treat with total of 7 days of antibiotics Associated septic shock has resolved. Cultures negative   Pneumonia of both lower lobes due to infectious organism Treating pneumonia as noted above  Acute on chronic diastolic CHF (congestive heart failure) (HCC) Evidence of diastolic dysfunction on echocardiogram with elevated BNP Development of pulmonary edema and pleural effusions after aggressive intravenous volume resuscitation earlier in the hospitalization resulting in acute cardiogenic volume overload Slowly improving  managing with intravenous Lasix as noted above.  Acute metabolic encephalopathy Mentation seems to have improved to baseline. Symptoms felt to be mostly be secondary to infection  Septic shock (HCC) No further clinical evidence of septic shock Discontinuing Levophed and midodrine Blood cultures thus far have been without growth  Protein-calorie malnutrition, severe (HCC) Poor oral intake with evidence of muscle wasting and cachexia on exam and BMI of 17.8. 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 Outpatient follow-up  Left lower lobe pulmonary nodule Recently identified during January 2024 hospitalization at [place] follow-up  Nicotine dependence, cigarettes, uncomplicated Patient is being counseled daily on smoking cessation. Providing patient with nicotine replacement therapy during this hospitalization.    Hypokalemia Replaced   Hypophosphatemia Replacing with phosphorus supplements Monitoring phosphorus levels with serial chemistries.    Subjective:  Patient states that her breathing is almost at baseline.  Associated cough is resolving.  Patient denies any chest pain.  Physical Exam:  Vitals:   07/07/22 0841 07/07/22 1229 07/07/22 1547 07/07/22 2006  BP: 123/73 125/77  (!) 141/79  Pulse: 78 96  81  Resp: '18 18  18  '$ Temp: 99.1 F (37.3 C) 98.6 F (37 C)  98.8 F (37.1 C)  TempSrc: Oral Oral  Oral  SpO2: 97% 98% 96% 96%  Weight: 47.3 kg     Height:        Constitutional: Awake alert and oriented x3, no associated distress.   Skin: no rashes, no lesions, poor skin turgor noted. Eyes: Pupils are equally reactive to light.  No evidence of scleral icterus or conjunctival pallor.  ENMT: Moist mucous membranes noted.  Posterior pharynx clear of any exudate or lesions.   Respiratory: Rhonchi bilaterally with mild bibasilar rales.  Normal respiratory effort without accessory muscle use.   Cardiovascular: Regular rate and rhythm, no murmurs / rubs / gallops.  Distal bilateral lower extremity pitting edema is improving. 2+ pedal pulses. No carotid bruits.  Abdomen: Abdomen is soft and nontender.  No evidence of intra-abdominal masses.  Positive bowel sounds noted in all quadrants.   Musculoskeletal: No joint deformity upper and  lower extremities. Good ROM, no contractures.  Poor muscle tone   Data Reviewed:  I have personally reviewed and interpreted labs, imaging.  Significant findings are   CBC: Recent Labs  Lab 07/01/22 0924 07/02/22 0331 07/03/22 0625 07/04/22 0301 07/05/22 0331 07/06/22 0321 07/07/22 0454  WBC 12.0*   < > 6.3 4.7 6.3 5.3 4.9  NEUTROABS 10.8*  --   --   --  5.4 4.5 3.8  HGB 9.5*   < > 8.4* 8.4* 9.0* 8.8* 8.9*  HCT 29.9*   < > 26.0* 26.9* 27.9* 27.9* 28.0*  MCV 93.7   < > 94.2 96.4 93.9 94.3 94.9  PLT 487*   < > 448* 362 418* 388 371   < > = values in this interval not displayed.   Basic Metabolic Panel: Recent Labs  Lab 07/01/22 1249 07/02/22 0331 07/03/22 0625 07/04/22 0301 07/05/22 0331 07/06/22 0321 07/07/22 0454  NA  --    < > 136 134* 140 134* 135  K  --    < > 3.6 3.4* 3.7 3.0* 3.8  CL  --    < > 110 106 102 96* 91*  CO2  --    < > 21* 21* 27 32 36*  GLUCOSE  --    < > 103* 81 87 95 86  BUN  --    < > 14 9 6* <5* 12  CREATININE  --    < > 0.54 0.54 0.49 0.43* 0.50  CALCIUM  --    < > 7.9* 7.8* 8.5* 7.9* 8.4*  MG 2.0  --   --   --  1.5* 1.8 1.8  PHOS 4.1  --   --   --   --   --  1.8*   < > =  values in this interval not displayed.   GFR: Estimated Creatinine Clearance: 48.9 mL/min (by C-G formula based on SCr of 0.5 mg/dL). Liver Function Tests: Recent Labs  Lab 07/01/22 0924 07/05/22 0331 07/06/22 0321 07/07/22 0454  AST 32 25 43* 37  ALT '16 17 22 25  '$ ALKPHOS 59 65 67 66  BILITOT 0.7 0.4 0.4 0.4  PROT 7.3 5.8* 5.9* 5.8*  ALBUMIN 3.2* 2.7* 2.7* 2.8*    Coagulation Profile: Recent Labs  Lab 07/01/22 1610 07/02/22 0331  INR 1.4* 1.3*      Code Status:  Full code.  Code status decision has been confirmed with: patient    Severity of Illness:  The appropriate patient status for this patient is INPATIENT. Inpatient status is judged to be reasonable and necessary in order to provide the required intensity of service to ensure the patient's  safety. The patient's presenting symptoms, physical exam findings, and initial radiographic and laboratory data in the context of their chronic comorbidities is felt to place them at high risk for further clinical deterioration. Furthermore, it is not anticipated that the patient will be medically stable for discharge from the hospital within 2 midnights of admission.   * I certify that at the point of admission it is my clinical judgment that the patient will require inpatient hospital care spanning beyond 2 midnights from the point of admission due to high intensity of service, high risk for further deterioration and high frequency of surveillance required.*  Time spent:  50 minutes  Author:  Vernelle Emerald MD  07/08/2022 1:54 AM

## 2022-07-08 NOTE — Progress Notes (Signed)
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  Patient presenting to [facility] emergency department via EMS from the local airport after being identified to be minimally responsive and lethargic by flight attendants prior to taking off on a [place] bound flight.   Upon evaluation in the emergency department patient was found to have bilateral lower lobe infiltrates concerning for pneumonia.  With clinical findings concerning for concurrent sepsis.  Patient was also found to be extremely agitated and was initiated on Precedex.  The PCCM service was called and patient was assessed for admission to the hospital.   During the patient's ICU course, patient developed septic shock.  Patient continued to receive intravenous antibiotics with ceftriaxone and azithromycin.  In the days that followed patient's blood pressures did improve and patient was transitioned from Levophed to midodrine and ultimately discontinued altogether.  Patient was accepted to the hospitalist service the morning of 3/9.   In the latter half of the patient's hospitalization, patient had a recurrent surgeons of acute hypoxic respiratory failure with chest imaging revealing pulmonary edema and worsening bilateral pleural effusions.  Echocardiogram revealed diastolic congestive heart failure and it was felt the patient was suffering from acute cardiogenic volume overload that developed after aggressive volume resuscitation earlier in the hospitalization.  Patient was temporarily placed on  heated high flow oxygen delivery and patient was aggressively diuresed with intravenous Lasix over the next several days.   Assessment & Plan:   Principal Problem:   Acute respiratory failure with hypoxia and hypercapnia (HCC) Active Problems:   Pneumonia of both lower lobes due to infectious organism   Acute on chronic diastolic CHF (congestive heart failure) (HCC)   Septic shock (HCC)   Acute metabolic encephalopathy   Protein-calorie malnutrition, severe (HCC)   Teratoma   Left lower lobe pulmonary nodule   Nicotine dependence, cigarettes, uncomplicated   Hypokalemia   Hypophosphatemia   Pressure injury of skin   Community acquired pneumonia  #1 acute respiratory failure with hypoxia and hypercapnia -Felt multifactorial secondary to bibasilar pneumonia with superimposed acute pulmonary edema with bilateral pleural effusions. -Oxygenation has been weaned down. -Patient seen by PCCM, received doses of IV Lasix and also received 2 more doses of IV Lasix 07/07/2022 with urine output of 2.8 L in the past 24 hours. -Repeat chest x-ray done this morning with similar appearance of pleural effusions and mild interstitial edema, emphysema. -Will give a dose of Lasix 40 mg IV x 1. -Status post full course of 7 days of IV antibiotics for pneumonia. -Supportive care. -Check ambulatory sats in the AM.  2.  Pneumonia -Noted on admission on CT scan with bilateral lower lobes. -Status post 7 days IV Rocephin and azithromycin. -Currently if above.  3.  Acute on chronic diastolic CHF -Patient noted with evidence of diastolic dysfunction on 2D echo with elevated BNP. -Patient noted development of pulmonary edema with pleural effusions after aggressive fluid resuscitation early on due to concerns for sepsis. -Improved with diuresis. -Patient with a urine output of 2.8 L over the  past 24 hours. -Repeat chest x-ray done this morning with similar appearance of pleural effusions and mild  interstitial edema, emphysema. -Give another dose of Lasix 40 mg IV x 1. -Strict I's and O's, daily weights. -Supportive care.  4.  Acute metabolic encephalopathy -Improved and currently at baseline. -Felt likely related to acute infection.  5.  Septic shock -Secondary to bilateral pneumonia. -Patient was on pressors of Levophed and midodrine which have been weaned off. -Blood cultures with no growth to date. -Status post 7 days IV antibiotics.  6.  Severe protein calorie malnutrition -Patient with poor oral intake with evidence of muscle wasting and cachexia on exam with a BMI of 17.8. -Continue nutritional supplementation.  7.  Hypokalemia -Secondary to diuresis. -Replete.  8.  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 -Outpatient follow-up.  9.  Left lower lobe pulmonary nodule -Will need outpatient follow-up with pulmonary.  10.  Nicotine dependence -Tobacco cessation. -Nicotine patch.  11.  Hypophosphatemia -Repleted.    DVT prophylaxis: Heparin Code Status: Full Family Communication: Updated patient.  No family at bedside. Disposition: Home with home health when clinically improved.  Status is: Inpatient Remains inpatient appropriate because: Severity of illness   Consultants:  PCCM: Dr. Verlee Monte 07/01/2022  Procedures Chest x-ray 07/01/2022, 07/04/2022, 07/05/2022, 07/06/2022, 07/08/2022 CT head 07/01/2022 CT angiogram chest 07/01/2022  Antimicrobials: Anti-infectives (From admission, onward)    Start     Dose/Rate Route Frequency Ordered Stop   07/01/22 1545  cefTRIAXone (ROCEPHIN) 2 g in sodium chloride 0.9 % 100 mL IVPB        2 g 200 mL/hr over 30 Minutes Intravenous Every 24 hours 07/01/22 1536 07/05/22 1506   07/01/22 1545  azithromycin (ZITHROMAX) 500 mg in sodium chloride 0.9 % 250 mL IVPB        500 mg 250 mL/hr over  60 Minutes Intravenous Every 24 hours 07/01/22 1536 07/05/22 1621         Subjective: Patient sitting in recliner.  No chest pain.  No shortness of breath.  No abdominal pain.  Overall feeling better.  Patient with sats of 96% on 2 L nasal cannula.  Objective: Vitals:   07/07/22 2006 07/08/22 0417 07/08/22 0845 07/08/22 1413  BP: (!) 141/79 127/81  (!) 143/83  Pulse: 81 70  81  Resp: '18 18  17  '$ Temp: 98.8 F (37.1 C) 98.4 F (36.9 C)  98.6 F (37 C)  TempSrc: Oral Oral  Oral  SpO2: 96% 97% 96% 96%  Weight:      Height:        Intake/Output Summary (Last 24 hours) at 07/08/2022 2033 Last data filed at 07/08/2022 1400 Gross per 24 hour  Intake 220 ml  Output 2750 ml  Net -2530 ml   Filed Weights   07/02/22 0701 07/03/22 0711 07/07/22 0841  Weight: 47.8 kg 53.1 kg 47.3 kg    Examination:  General exam: Appears calm and comfortable  Respiratory system: Clear to auscultation. Respiratory effort normal. Cardiovascular system: S1 & S2 heard, RRR. No JVD, murmurs, rubs, gallops or clicks. No pedal edema. Gastrointestinal system: Abdomen is nondistended, soft and nontender. No organomegaly or masses felt. Normal bowel sounds heard. Central nervous system: Alert and oriented. No focal neurological deficits. Extremities: Symmetric 5 x 5 power. Skin: No rashes, lesions or ulcers Psychiatry: Judgement and insight appear normal. Mood & affect  appropriate.     Data Reviewed: I have personally reviewed following labs and imaging studies  CBC: Recent Labs  Lab 07/04/22 0301 07/05/22 0331 07/06/22 0321 07/07/22 0454 07/08/22 0407  WBC 4.7 6.3 5.3 4.9 5.5  NEUTROABS  --  5.4 4.5 3.8 3.8  HGB 8.4* 9.0* 8.8* 8.9* 9.0*  HCT 26.9* 27.9* 27.9* 28.0* 28.1*  MCV 96.4 93.9 94.3 94.9 95.3  PLT 362 418* 388 371 [id number]    Basic Metabolic Panel: Recent Labs  Lab 07/04/22 0301 07/05/22 0331 07/06/22 0321 07/07/22 0454 07/08/22 0407  NA 134* 140 134* 135 136  K 3.4* 3.7 3.0*  3.8 3.2*  CL 106 102 96* 91* 87*  CO2 21* 27 32 36* 38*  GLUCOSE 81 87 95 86 101*  BUN 9 6* <5* 12 21  CREATININE 0.54 0.49 0.43* 0.50 0.46  CALCIUM 7.8* 8.5* 7.9* 8.4* 8.5*  MG  --  1.5* 1.8 1.8 1.8  PHOS  --   --   --  1.8* 3.8    GFR: Estimated Creatinine Clearance: 48.9 mL/min (by C-G formula based on SCr of 0.46 mg/dL).  Liver Function Tests: Recent Labs  Lab 07/05/22 0331 07/06/22 0321 07/07/22 0454 07/08/22 0407  AST 25 43* 37 35  ALT '17 22 25 26  '$ ALKPHOS 65 67 66 78  BILITOT 0.4 0.4 0.4 0.4  PROT 5.8* 5.9* 5.8* 5.7*  ALBUMIN 2.7* 2.7* 2.8* 3.0*    CBG: Recent Labs  Lab 07/07/22 2353 07/08/22 0414 07/08/22 0745 07/08/22 1219 07/08/22 1833  GLUCAP 104* 99 93 155* 112*     Recent Results (from the past 240 hour(s))  Resp panel by RT-PCR (RSV, Flu A&B, Covid) Anterior Nasal Swab     Status: None   Collection Time: 07/01/22 12:34 PM   Specimen: Anterior Nasal Swab  Result Value Ref Range Status   SARS Coronavirus 2 by RT PCR NEGATIVE NEGATIVE Final    Comment: (NOTE) SARS-CoV-2 target nucleic acids are NOT DETECTED.  The SARS-CoV-2 RNA is generally detectable in upper respiratory specimens during the acute phase of infection. The lowest concentration of SARS-CoV-2 viral copies this assay can detect is 138 copies/mL. A negative result does not preclude SARS-Cov-2 infection and should not be used as the sole basis for treatment or other patient management decisions. A negative result may occur with  improper specimen collection/handling, submission of specimen other than nasopharyngeal swab, presence of viral mutation(s) within the areas targeted by this assay, and inadequate number of viral copies(<138 copies/mL). A negative result must be combined with clinical observations, patient history, and epidemiological information. The expected result is Negative.  Fact Sheet for Patients:  EntrepreneurPulse.com.au  Fact Sheet for  Healthcare Providers:  IncredibleEmployment.be  This test is no t yet approved or cleared by the [place] FDA and  has been authorized for detection and/or diagnosis of SARS-CoV-2 by FDA under an Emergency Use Authorization (EUA). This EUA will remain  in effect (meaning this test can be used) for the duration of the COVID-19 declaration under Section 564(b)(1) of the Act, 21 U.S.C.section 360bbb-3(b)(1), unless the authorization is terminated  or revoked sooner.       Influenza A by PCR NEGATIVE NEGATIVE Final   Influenza B by PCR NEGATIVE NEGATIVE Final    Comment: (NOTE) The Xpert Xpress SARS-CoV-2/FLU/RSV plus assay is intended as an aid in the diagnosis of influenza from Nasopharyngeal swab specimens and should not be used as a sole basis for treatment. Nasal washings and aspirates  are unacceptable for Xpert Xpress SARS-CoV-2/FLU/RSV testing.  Fact Sheet for Patients: EntrepreneurPulse.com.au  Fact Sheet for Healthcare Providers: IncredibleEmployment.be  This test is not yet approved or cleared by the [place] FDA and has been authorized for detection and/or diagnosis of SARS-CoV-2 by FDA under an Emergency Use Authorization (EUA). This EUA will remain in effect (meaning this test can be used) for the duration of the COVID-19 declaration under Section 564(b)(1) of the Act, 21 U.S.C. section 360bbb-3(b)(1), unless the authorization is terminated or revoked.     Resp Syncytial Virus by PCR NEGATIVE NEGATIVE Final    Comment: (NOTE) Fact Sheet for Patients: EntrepreneurPulse.com.au  Fact Sheet for Healthcare Providers: IncredibleEmployment.be  This test is not yet approved or cleared by the [place] FDA and has been authorized for detection and/or diagnosis of SARS-CoV-2 by FDA under an Emergency Use Authorization (EUA). This EUA will remain in effect (meaning this  test can be used) for the duration of the COVID-19 declaration under Section 564(b)(1) of the Act, 21 U.S.C. section 360bbb-3(b)(1), unless the authorization is terminated or revoked.  Performed at [facility], [place] [address], [place], [place] [address]   Respiratory (~20 pathogens) panel by PCR     Status: None   Collection Time: 07/01/22 12:34 PM  Result Value Ref Range Status   Adenovirus NOT DETECTED NOT DETECTED Final   Coronavirus 229E NOT DETECTED NOT DETECTED Final    Comment: (NOTE) The Coronavirus on the Respiratory Panel, DOES NOT test for the novel  Coronavirus (2019 nCoV)    Coronavirus HKU1 NOT DETECTED NOT DETECTED Final   Coronavirus NL63 NOT DETECTED NOT DETECTED Final   Coronavirus OC43 NOT DETECTED NOT DETECTED Final   Metapneumovirus NOT DETECTED NOT DETECTED Final   Rhinovirus / Enterovirus NOT DETECTED NOT DETECTED Final   Influenza A NOT DETECTED NOT DETECTED Final   Influenza B NOT DETECTED NOT DETECTED Final   Parainfluenza Virus 1 NOT DETECTED NOT DETECTED Final   Parainfluenza Virus 2 NOT DETECTED NOT DETECTED Final   Parainfluenza Virus 3 NOT DETECTED NOT DETECTED Final   Parainfluenza Virus 4 NOT DETECTED NOT DETECTED Final   Respiratory Syncytial Virus NOT DETECTED NOT DETECTED Final   Bordetella pertussis NOT DETECTED NOT DETECTED Final   Bordetella Parapertussis NOT DETECTED NOT DETECTED Final   Chlamydophila pneumoniae NOT DETECTED NOT DETECTED Final   Mycoplasma pneumoniae NOT DETECTED NOT DETECTED Final    Comment: Performed at [facility], [place]. [address] [place], [place] [address]  Urine Culture (for pregnant, neutropenic or urologic patients or patients with an indwelling urinary catheter)     Status: None   Collection Time: 07/01/22  3:36 PM   Specimen: Urine, Clean Catch  Result Value Ref Range Status   Specimen Description   Final    URINE, CLEAN CATCH Performed at [facility], [place]  [address], [place], [place] [address]    Special Requests   Final    NONE Performed at [facility], [place] [address] [place], [place] [address]    Culture   Final    NO GROWTH Performed at [facility], [place] [address], [place], [place] [address]    Report Status 07/02/2022 FINAL  Final  Blood Culture (routine x 2)     Status: None   Collection Time: 07/01/22  4:10 PM   Specimen: BLOOD RIGHT ARM  Result Value Ref Range Status   Specimen Description   Final  BLOOD RIGHT ARM Performed at [facility], [place] [address], [place], [place] [address]    Special Requests   Final    BOTTLES DRAWN AEROBIC AND ANAEROBIC Blood Culture adequate volume Performed at [place] [address] [place], [place] [address]    Culture   Final    NO GROWTH 5 DAYS Performed at [facility], [place] [address] [place], [place] [address]    Report Status 07/06/2022 FINAL  Final  Blood Culture (routine x 2)     Status: None   Collection Time: 07/01/22  4:10 PM   Specimen: BLOOD LEFT ARM  Result Value Ref Range Status   Specimen Description   Final    BLOOD LEFT ARM Performed at [place] [address] [place], [place] [address]    Special Requests   Final    BOTTLES DRAWN AEROBIC AND ANAEROBIC Blood Culture adequate volume Performed at [place] [address] [place], [place] [address]    Culture   Final    NO GROWTH 5 DAYS Performed at [facility], [place] [address] [place], [place] [address]    Report Status 07/06/2022 FINAL  Final  MRSA Next Gen by PCR, Nasal     Status: None   Collection Time: 07/01/22  9:49 PM   Specimen: Nasal Mucosa; Nasal Swab  Result Value Ref Range Status   MRSA by PCR Next Gen NOT DETECTED NOT DETECTED Final    Comment: (NOTE) The GeneXpert MRSA Assay (FDA approved for NASAL specimens only), is one component of a comprehensive MRSA  colonization surveillance program. It is not intended to diagnose MRSA infection nor to guide or monitor treatment for MRSA infections. Test performance is not FDA approved in patients less than 22 years old. Performed at [facility], [place] [address] [place], [place] [address]          Radiology Studies: DG Chest 1 View  Result Date: 07/08/2022 CLINICAL DATA:  Pleural effusion. EXAM: CHEST  1 VIEW COMPARISON:  07/06/2022 FINDINGS: There is a left arm PICC line with tip at the superior cavoatrial junction. Stable cardiomediastinal contours. Lungs are hyperinflated with interstitial changes of emphysema. Bilateral pleural effusions and mild interstitial edema suspected. Unchanged from the previous study. IMPRESSION: 1. Similar appearance of pleural effusions and mild interstitial edema. 2.  Emphysema (ICD10-J43.9). Electronically Signed   By: Kerby Moors M.D.   On: 07/08/2022 08:07        Scheduled Meds:  Chlorhexidine Gluconate Cloth  6 each Topical Daily   feeding supplement  237 mL Oral TID BM   folic acid  1 mg Oral Daily   furosemide  40 mg Intravenous Once   guaiFENesin  1,200 mg Oral BID   heparin  5,000 Units Subcutaneous Q8H   multivitamin with minerals  1 tablet Oral Daily   nicotine  14 mg Transdermal Q0600   nutrition supplement (JUVEN)  1 packet Oral BID BM   revefenacin  175 mcg Nebulization Daily   sodium chloride flush  10-40 mL Intracatheter Q12H   thiamine  100 mg Oral Daily   Continuous Infusions:  sodium chloride Stopped (07/04/22 1745)     LOS: 7 days    Time spent: 40 mins    Irine Seal, MD [facility]   To contact the attending provider between 7A-7P or the covering provider during after hours 7P-7A, please log into the web site [email or web address] and  access using universal  password for that web site. If you do not have the password, please call the hospital operator.  07/08/2022, 8:33 PM

## 2022-07-08 NOTE — Assessment & Plan Note (Signed)
Replacing with phosphorus supplements Monitoring phosphorus levels with serial chemistries.

## 2022-07-09 LAB — CBC WITH DIFFERENTIAL/PLATELET
Abs Immature Granulocytes: 0.02 10*3/uL (ref 0.00–0.07)
Basophils Absolute: 0 10*3/uL (ref 0.0–0.1)
Basophils Relative: 1 %
Eosinophils Absolute: 0 10*3/uL (ref 0.0–0.5)
Eosinophils Relative: 1 %
HCT: 29 % — ABNORMAL LOW (ref 36.0–46.0)
Hemoglobin: 9.4 g/dL — ABNORMAL LOW (ref 12.0–15.0)
Immature Granulocytes: 0 %
Lymphocytes Relative: 24 %
Lymphs Abs: 1.3 10*3/uL (ref 0.7–4.0)
MCH: 30.9 pg (ref 26.0–34.0)
MCHC: 32.4 g/dL (ref 30.0–36.0)
MCV: 95.4 fL (ref 80.0–100.0)
Monocytes Absolute: 0.4 10*3/uL (ref 0.1–1.0)
Monocytes Relative: 8 %
Neutro Abs: 3.6 10*3/uL (ref 1.7–7.7)
Neutrophils Relative %: 66 %
Platelets: 369 10*3/uL (ref 150–400)
RBC: 3.04 MIL/uL — ABNORMAL LOW (ref 3.87–5.11)
RDW: 18.6 % — ABNORMAL HIGH (ref 11.5–15.5)
WBC: 5.4 10*3/uL (ref 4.0–10.5)
nRBC: 0 % (ref 0.0–0.2)

## 2022-07-09 LAB — BASIC METABOLIC PANEL
Anion gap: 11 (ref 5–15)
BUN: 23 mg/dL (ref 8–23)
CO2: 36 mmol/L — ABNORMAL HIGH (ref 22–32)
Calcium: 8.7 mg/dL — ABNORMAL LOW (ref 8.9–10.3)
Chloride: 88 mmol/L — ABNORMAL LOW (ref 98–111)
Creatinine, Ser: 0.48 mg/dL (ref 0.44–1.00)
GFR, Estimated: >60 mL/min (ref >60)
Glucose, Bld: 114 mg/dL — ABNORMAL HIGH (ref 70–99)
Potassium: 3.7 mmol/L (ref 3.5–5.1)
Sodium: 135 mmol/L (ref 135–145)

## 2022-07-09 LAB — GLUCOSE, CAPILLARY
Glucose-Capillary: 105 mg/dL — ABNORMAL HIGH (ref 70–99)
Glucose-Capillary: 96 mg/dL (ref 70–99)
Glucose-Capillary: 100 mg/dL — ABNORMAL HIGH (ref 70–99)
Glucose-Capillary: 105 mg/dL — ABNORMAL HIGH (ref 70–99)

## 2022-07-09 LAB — MAGNESIUM: Magnesium: 1.9 mg/dL (ref 1.7–2.4)

## 2022-07-09 LAB — ZINC: Zinc: 42 ug/dL — ABNORMAL LOW (ref 44–115)

## 2022-07-09 MED ORDER — FOLIC ACID 1 MG PO TABS: 1.0000 mg | ORAL_TABLET | Freq: Every day | ORAL | Status: AC

## 2022-07-09 MED ORDER — FUROSEMIDE 10 MG/ML IJ SOLN
40.0000 mg | Freq: Once | INTRAMUSCULAR | Status: AC
  Administered 2022-07-09: 40 mg via INTRAVENOUS
  Filled 2022-07-09: qty 4

## 2022-07-09 MED ORDER — POTASSIUM CHLORIDE CRYS ER 20 MEQ PO TBCR
40.0000 meq | EXTENDED_RELEASE_TABLET | Freq: Once | ORAL | Status: DC
  Filled 2022-07-09: qty 2

## 2022-07-09 MED ORDER — ANORO ELLIPTA 62.5-25 MCG/ACT IN AEPB: 1.0000 | INHALATION_SPRAY | Freq: Every day | RESPIRATORY_TRACT | 1 refills | Status: AC

## 2022-07-09 MED ORDER — VITAMIN B-1 100 MG PO TABS: 100.0000 mg | ORAL_TABLET | Freq: Every day | ORAL | Status: AC

## 2022-07-09 MED ORDER — NICOTINE 14 MG/24HR TD PT24: 14.0000 mg | MEDICATED_PATCH | Freq: Every day | TRANSDERMAL | 0 refills | Status: AC

## 2022-07-09 MED ORDER — AMLODIPINE BESYLATE 5 MG PO TABS
5.0000 mg | ORAL_TABLET | Freq: Every day | ORAL | Status: DC
  Administered 2022-07-09: 5 mg via ORAL
  Filled 2022-07-09: qty 1

## 2022-07-09 MED ORDER — ADULT MULTIVITAMIN W/MINERALS CH: 1.0000 | ORAL_TABLET | Freq: Every day | ORAL | Status: AC

## 2022-07-09 MED ORDER — ENSURE ENLIVE PO LIQD: 237.0000 mL | Freq: Three times a day (TID) | ORAL | 12 refills | Status: AC

## 2022-07-09 MED ORDER — JUVEN PO PACK: 1.0000 | PACK | Freq: Two times a day (BID) | ORAL | 0 refills | Status: AC

## 2022-07-09 NOTE — Progress Notes (Signed)
Desat screen note: Per PT note SATURATION QUALIFICATIONS: (This note is used to comply with regulatory documentation for home oxygen)   Patient Saturations on Room Air at Rest = 87%   Patient Saturations on Room Air while Ambulating = 85%   Patient Saturations on 2 Liters of oxygen while Ambulating = 90%   Please briefly explain why patient needs home oxygen:To maintain  oxygenation during functional activities such as ambulation.  No charge.

## 2022-07-09 NOTE — Discharge Summary (Signed)
Physician Discharge Summary  Marie Sloan [id number] DOB: 11-02-1950 DOA: 07/01/2022  PCP: Patient, No Pcp Per  Admit date: 07/01/2022 Discharge date: 07/09/2022  Time spent: 60 minutes  Recommendations for Outpatient Follow-up:  Follow-up with PCP in 1 to 2 weeks.  On follow-up patient will need a basic metabolic profile and magnesium level done to follow-up on electrolytes and renal function.  Patient will need repeat ambulatory sats done in the outpatient setting as well as follow-up on blood pressure.  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 Follow-up with  pulmonary, Dr.Meier in 3 to 4 weeks for follow-up on pulmonary nodule and further evaluation and management for COPD including formal PFTs.   Discharge Diagnoses:  Principal Problem:   Acute respiratory failure with hypoxia and hypercapnia (HCC) Active Problems:   Pneumonia of both lower lobes due to infectious organism   Acute on chronic diastolic CHF (congestive heart failure) (HCC)   Septic shock (HCC)   Acute metabolic encephalopathy   Protein-calorie malnutrition, severe (HCC)   Teratoma   Left lower lobe pulmonary nodule   Nicotine dependence, cigarettes, uncomplicated   Hypokalemia   Hypophosphatemia   Pressure injury of skin   Community acquired pneumonia   Discharge Condition: Stable and improved.  Diet recommendation: Regular  Filed Weights   07/03/22 0711 07/07/22 0841 07/09/22 0709  Weight: 53.1 kg 47.3 kg 41.6 kg    History of present illness:  HPI per Dr.Meier HPI obtained from EMR review given patient's ongoing encephalopathy.    72 year old female with PMH significant for tobacco abuse, ?ETOH use, HTN, and COPD presenting to [facility] ER with altered mental status and hypoxia from airport.  Patient was found minimally responsive by flight attendants on a flight to [place] with pills in her lap.  EMS reported room air sats 86%.  Arrived responsive to noxious stimuli in ER on  NRB.      Recently evaluated in [facility] ER 05/19/22 with multiple complaints of weakness, poor PO intake, LE edema, and recent pneumonia evaluated for possible GIB given Hgb 7.7.  Additionally found to have left lung nodule LLL 42m, and large pelvic mass thought to be possible teratoma.  She declined further GI evaluation/ EGD, echo, and was to follow up in outpatient.    Pt afebrile, HR NSR, SBP 90-110's, and requiring 4L [place].  ER workup notable for CO2 19, glucose 114, BUN/ sCr 42/ 1.06, albumin 3.2, trop hs neg 7> 8, normal lactic x 2, WBC 12, H/H 9.5/ 29.9, INR 1.4, neg SARS/ flu/ RSV, ETOH neg, UDS neg, CTH negative for acute intracranial process, CTA PE was neg for PE but showed patchy lower alveolar infiltrates, R> L suggestive of atelectasis vs pneumonia, with scattered ground glass densities in upper lung fields, and 11 mm pleural based LLL nodule.  Cultures sent and started on azithromycin and ceftriaxone, with 1.5L LR bolus.  Narcan given with no reported improvements.  Patient remains altered, minimally responsive at times then agitated and yelling at others, PCCM consulted for ICU admit.  ER RN reports pills found with patient were her norvasc, anoro, biotin, and vitamins, nothing of concern.   Hospital Course:  72year old female with past medical history of alcohol use, nicotine dependence, chronic pain syndrome and recent diagnosis of 11.8 cm pelvic mass thought to be a teratoma as well as a pulmonary nodule in the left lower lobe both [facility] [place] [facility].  [facility] via EMS from the local airport after being identified to be minimally responsive and lethargic by flight attendants prior to taking off on a [place] bound flight.   Upon evaluation in the emergency department patient was found to have bilateral lower lobe infiltrates concerning for pneumonia.  With clinical findings concerning for  concurrent sepsis.  Patient was also found to be extremely agitated and was initiated on Precedex.  The PCCM service was called and patient was assessed for admission to the hospital.   During the patient's ICU course, patient developed septic shock.  Patient continued to receive intravenous antibiotics with ceftriaxone and azithromycin.  In the days that followed patient's blood pressures did improve and patient was transitioned from Levophed to midodrine and ultimately discontinued altogether.  Patient was accepted to the hospitalist service the morning of 3/9.   In the latter half of the patient's hospitalization, patient had a recurrent surgeons of acute hypoxic respiratory failure with chest imaging revealing pulmonary edema and worsening bilateral pleural effusions.  Echocardiogram revealed diastolic congestive heart failure and it was felt the patient was suffering from acute cardiogenic volume overload that developed after aggressive volume resuscitation earlier in the hospitalization.  Patient was temporarily placed on heated high flow oxygen delivery and patient was aggressively diuresed with intravenous Lasix over the next several days.  #1 acute respiratory failure with hypoxia and hypercapnia -Felt multifactorial secondary to bibasilar pneumonia with superimposed acute pulmonary edema with bilateral pleural effusions. -Oxygenation weaned down, however patient noted to need home O2 on discharge.  Ambulatory sats done on day of discharge. -Patient seen by PCCM, received doses of IV Lasix and also received 2 more doses of IV Lasix 07/07/2022 with urine output of 2.8 L. -Patient received another dose of IV Lasix 07/08/2022 and on day of discharge 07/09/2022. -Repeat chest x-ray done (07/08/2022) with similar appearance of pleural effusions and mild interstitial edema, emphysema. -Patient improved clinically. -Ambulatory sats done on day of discharge with sats of 87% at rest on room air, 85% on room  air with ambulation and 90% on 2 L of oxygen while ambulating. -Patient be discharged on home O2. -Chest x-ray done did note emphysema, patient with extensive tobacco history. -Status post full course of 7 days of IV antibiotics for pneumonia. -Will need outpatient follow-up with PCP and pulmonary. -Will need repeat ambulatory sats done on follow-up in the outpatient setting.   2.  Pneumonia -Noted on admission on CT scan with bilateral lower lobes. -Status post 7 days IV Rocephin and azithromycin. -Outpatient follow-up with PCP.   3.  Acute on chronic diastolic CHF -Patient noted with evidence of diastolic dysfunction on 2D echo with elevated BNP. -Patient noted development of pulmonary edema with pleural effusions after aggressive fluid resuscitation early on due to concerns for sepsis. -Improved with diuresis. -To be -1.6 L during this hospitalization. -Repeat chest x-ray done the morning of 07/08/2022, with similar appearance of pleural effusions and mild interstitial edema, emphysema. -Patient received 2 more doses of Lasix 40 mg IV. -No oral Lasix needed on discharge. -Outpatient follow up with PCP.   4.  Acute metabolic encephalopathy -Improved and currently at baseline. -Felt likely related to acute infection.   5.  Septic shock -Secondary to bilateral pneumonia. -Patient was on pressors of Levophed and midodrine which have been weaned off. -Blood cultures with no growth to date. -Status post 7 days IV antibiotics. -Septic shock had resolved by day of discharge.   6.  Severe protein calorie  malnutrition -Patient with poor oral intake with evidence of muscle wasting and cachexia on exam with a BMI of 17.8. -Patient place on nutritional supplementation.   -Outpatient follow-up with patient's PCP.     7.  Hypokalemia -Secondary to diuresis. -Repleted.   8.  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 -Outpatient follow-up.   9.  Left lower lobe pulmonary nodule -Will need outpatient follow-up with pulmonary. -Ambulatory referral made.   10.  Nicotine dependence -Tobacco cessation. -Nicotine patch placed during the hospitalization and prescription written on discharge..   11.  Hypophosphatemia -Repleted.  12.  Pressure injury, POA Pressure Injury 07/01/22 Buttocks Left Stage 2 -  Partial thickness loss of dermis presenting as a shallow open injury with a red, pink wound bed without slough. (Active)  07/01/22 1745  Location: Buttocks  Location Orientation: Left  Staging: Stage 2 -  Partial thickness loss of dermis presenting as a shallow open injury with a red, pink wound bed without slough.  Wound Description (Comments):   Present on Admission: Yes          Procedures: Chest x-ray 07/01/2022, 07/04/2022, 07/05/2022, 07/06/2022, 07/08/2022 CT head 07/01/2022 CT angiogram chest 07/01/2022  Consultations: PCCM: Dr. Verlee Monte 07/01/2022  Discharge Exam: Vitals:   07/09/22 0854 07/09/22 1445  BP:  139/75  Pulse:  85  Resp:  18  Temp:  98.6 F (37 C)  SpO2: 93% 96%    General: NAD.  Frail.  Thin. Cardiovascular: Regular rate rhythm no murmurs rubs or gallops.  No JVD.  No lower extremity edema. Respiratory: Clear to auscultation bilaterally.  Some decreased breath sounds in the bases.  No wheezes, no crackles, no rhonchi.  Fair air movement.  Speaking in full sentences.  Discharge Instructions   Discharge Instructions     Ambulatory referral to Pulmonology   Complete by: As directed    Follow-up on pulmonary nodule and further evaluation of probable COPD.   Reason for referral: Lung Mass/Lung Nodule   Diet general   Complete by: As directed    Discharge wound care:   Complete by: As directed    Pressure Injury 07/01/22 Buttocks Left Stage 2 -  Partial thickness loss of dermis presenting as a shallow open  injury with a red, pink wound bed without slough.   Increase activity slowly   Complete by: As directed       Allergies as of 07/09/2022       Reactions   Hydrocodone Nausea And Vomiting   Penicillins Other (See Comments)   Caused shaking        Medication List     STOP taking these medications    amoxicillin-clavulanate 875-125 MG tablet Commonly known as: AUGMENTIN   doxycycline 100 MG tablet Commonly known as: ADOXA       TAKE these medications    amLODipine 5 MG tablet Commonly known as: NORVASC Take 5 mg by mouth daily.   Anoro Ellipta 62.5-25 MCG/ACT Aepb Generic drug: umeclidinium-vilanterol Inhale 1 puff into the lungs daily.   BIOTIN PO Take 1 tablet by mouth daily.   feeding supplement Liqd Take 237 mLs by mouth 3 (three) times daily between meals.   nutrition supplement (JUVEN) Pack Take 1 packet by mouth 2 (two) times daily between meals. Start taking on: March 15, 123456   folic acid 1 MG tablet Commonly known as:  FOLVITE Take 1 tablet (1 mg total) by mouth daily. Start taking on: July 10, 2022   multivitamin with minerals Tabs tablet Take 1 tablet by mouth daily. Start taking on: July 10, 2022   nicotine 14 mg/24hr patch Commonly known as: NICODERM CQ - dosed in mg/24 hours Place 1 patch (14 mg total) onto the skin daily at 6 (six) AM. Start taking on: July 10, 2022   OVER THE COUNTER MEDICATION vitamins   thiamine 100 MG tablet Commonly known as: Vitamin B-1 Take 1 tablet (100 mg total) by mouth daily. Start taking on: July 10, 2022               Durable Medical Equipment  (From admission, onward)           Start     Ordered   07/09/22 1423  For home use only DME oxygen  Once       Question Answer Comment  Length of Need 6 Months   Mode or (Route) Nasal cannula   Liters per Minute 3   Frequency Continuous (stationary and portable oxygen unit needed)   Oxygen conserving device Yes   Oxygen delivery system  Gas      07/09/22 1422              Discharge Care Instructions  (From admission, onward)           Start     Ordered   07/09/22 0000  Discharge wound care:       Comments: Pressure Injury 07/01/22 Buttocks Left Stage 2 -  Partial thickness loss of dermis presenting as a shallow open injury with a red, pink wound bed without slough.   07/09/22 1459           Allergies  Allergen Reactions   Hydrocodone Nausea And Vomiting   Penicillins Other (See Comments)    Caused shaking    Follow-up Information     [facility] Follow up.   Contact information: [place] [address] [phone]         [place] [facility] Follow up.   Contact information: [place] [address] [phone]         PCP. Schedule an appointment as soon as possible for a visit in 2 week(s).   Why: Follow-up in 1 to 2 weeks or as previously scheduled.        Maryjane Hurter, MD. Schedule an appointment as soon as possible for a visit in 3 week(s).   Specialty: Pulmonary Disease Why: Follow-up in 3 to 4 weeks for follow-up on pulmonary nodule, further evaluation of probable COPD. Contact information: [address] [place] [address] [phone]                  The results of significant diagnostics from this hospitalization (including imaging, microbiology, ancillary and laboratory) are listed below for reference.    Significant Diagnostic Studies: DG Chest 1 View  Result Date: 07/08/2022 CLINICAL DATA:  Pleural effusion. EXAM: CHEST  1 VIEW COMPARISON:  07/06/2022 FINDINGS: There is a left arm PICC line with tip at the superior cavoatrial junction. Stable cardiomediastinal contours. Lungs are hyperinflated with interstitial changes of emphysema. Bilateral pleural effusions and mild interstitial edema suspected. Unchanged from the previous study. IMPRESSION: 1. Similar appearance of pleural effusions and mild interstitial edema.  2.  Emphysema (ICD10-J43.9). Electronically Signed   By: Kerby Moors M.D.   On: 07/08/2022 08:07  DG Chest 1 View  Result Date: 07/06/2022 CLINICAL DATA:  72 year old female with evidence of increasing pleural effusions from earlier this month. EXAM: CHEST  1 VIEW COMPARISON:  Portable chest 07/05/2022, CTA chest 07/01/2022. FINDINGS: Portable AP semi upright view at 0859 hours. Ongoing bilateral veiling pleural effusions, small to moderate and greater on the right. Lung volumes are stable. Mediastinal contours are stable. Left PICC line in place. No pneumothorax. Compared to 2018 radiographs background pulmonary interstitial opacity appears stable, no overt edema. Visualized tracheal air column is within normal limits. Stable visualized osseous structures. Paucity of bowel gas in the visible abdomen. IMPRESSION: 1. Ongoing bilateral pleural effusions, small to moderate and probably greater on the right. 2. No new cardiopulmonary abnormality. Electronically Signed   By: Genevie Ann M.D.   On: 07/06/2022 09:13   DG CHEST PORT 1 VIEW  Result Date: 07/05/2022 CLINICAL DATA:  Pleural effusion.  Acute respiratory hypoxia. EXAM: PORTABLE CHEST 1 VIEW COMPARISON:  07/04/2022 FINDINGS: LEFT-sided PICC line tip overlies the level of superior vena cava. There are significant bibasilar opacities, obscuring the heart margins. Bilateral pleural effusions are present, RIGHT greater than LEFT. The appearance is similar to prior study. IMPRESSION: 1. Stable appearance of significant bibasilar opacities. 2. Bilateral pleural effusions, RIGHT greater than LEFT. Electronically Signed   By: Nolon Nations M.D.   On: 07/05/2022 08:24   DG Chest 1 View  Result Date: 07/04/2022 CLINICAL DATA:  Pneumonia. EXAM: CHEST  1 VIEW COMPARISON:  07/01/2022. FINDINGS: 0837 hours. Left upper extremity PICC with tip projecting over the superior cavoatrial junction. New moderate right and small left pleural effusions with adjacent  atelectasis of the lung bases. No pneumothorax. Left and right heart borders are partially obscured. Mild pulmonary venous congestion. IMPRESSION: New moderate right and small left pleural effusions with adjacent atelectasis of the lung bases. Mild pulmonary venous congestion. Electronically Signed   By: Emmit Alexanders M.D.   On: 07/04/2022 09:27   ECHOCARDIOGRAM COMPLETE  Result Date: 07/03/2022    ECHOCARDIOGRAM REPORT   Patient Name:   CHEYANA BATTLES Date of Exam: 07/03/2022 Medical Rec #:  [id number]        Height:       68.0 in Accession #:    [id number]       Weight:       105.4 lb Date of Birth:  [birth year]        BSA:          1.557 m Patient Age:    57 years         BP:           129/52 mmHg Patient Gender: F                HR:           81 bpm. Exam Location:  Inpatient Procedure: 2D Echo, Cardiac Doppler and Color Doppler Indications:    R06.02 SOB  History:        Patient has no prior history of Echocardiogram examinations.                 Signs/Symptoms:Altered Mental Status and Dyspnea; Risk                 Factors:Current Smoker. ETOH.  Sonographer:    Roseanna Rainbow RDCS Referring Phys: [id number] [place]  1. Left ventricular ejection fraction, by estimation, is 65 to 70%. The left ventricle has normal function. The left ventricle has no  regional wall motion abnormalities. There is mild left ventricular hypertrophy. Left ventricular diastolic parameters are consistent with Grade I diastolic dysfunction (impaired relaxation).  2. Right ventricular systolic function is normal. The right ventricular size is normal. There is normal pulmonary artery systolic pressure.  3. The mitral valve is normal in structure. Trivial mitral valve regurgitation. No evidence of mitral stenosis. Moderate mitral annular calcification.  4. The aortic valve is tricuspid. There is mild calcification of the aortic valve. Aortic valve regurgitation is not visualized. Aortic valve sclerosis/calcification is present,  without any evidence of aortic stenosis.  5. Aortic dilatation noted. There is borderline dilatation of the ascending aorta, measuring 38 mm.  6. The inferior vena cava is dilated in size with <50% respiratory variability, suggesting right atrial pressure of 15 mmHg. FINDINGS  Left Ventricle: Left ventricular ejection fraction, by estimation, is 65 to 70%. The left ventricle has normal function. The left ventricle has no regional wall motion abnormalities. The left ventricular internal cavity size was normal in size. There is  mild left ventricular hypertrophy. Left ventricular diastolic parameters are consistent with Grade I diastolic dysfunction (impaired relaxation). Right Ventricle: The right ventricular size is normal. No increase in right ventricular wall thickness. Right ventricular systolic function is normal. There is normal pulmonary artery systolic pressure. The tricuspid regurgitant velocity is 2.58 m/s, and  with an assumed right atrial pressure of 8 mmHg, the estimated right ventricular systolic pressure is [id number] mmHg. Left Atrium: Left atrial size was normal in size. Right Atrium: Right atrial size was normal in size. Pericardium: Trivial pericardial effusion is present. The pericardial effusion is posterior to the left ventricle. Mitral Valve: The mitral valve is normal in structure. Moderate mitral annular calcification. Trivial mitral valve regurgitation. No evidence of mitral valve stenosis. Tricuspid Valve: The tricuspid valve is normal in structure. Tricuspid valve regurgitation is trivial. No evidence of tricuspid stenosis. Aortic Valve: The aortic valve is tricuspid. There is mild calcification of the aortic valve. Aortic valve regurgitation is not visualized. Aortic valve sclerosis/calcification is present, without any evidence of aortic stenosis. Pulmonic Valve: The pulmonic valve was normal in structure. Pulmonic valve regurgitation is not visualized. No evidence of pulmonic stenosis. Aorta:  Aortic dilatation noted. There is borderline dilatation of the ascending aorta, measuring 38 mm. Venous: The inferior vena cava is dilated in size with less than 50% respiratory variability, suggesting right atrial pressure of 15 mmHg. IAS/Shunts: No atrial level shunt detected by color flow Doppler. Additional Comments: There is pleural effusion in the left lateral region.  LEFT VENTRICLE PLAX 2D LVIDd:         3.30 cm   Diastology LVIDs:         1.80 cm   LV e' medial:    7.51 cm/s LV PW:         1.40 cm   LV E/e' medial:  13.4 LV IVS:        1.20 cm   LV e' lateral:   6.09 cm/s LVOT diam:     2.30 cm   LV E/e' lateral: 16.6 LV SV:         116 LV SV Index:   74 LVOT Area:     4.15 cm  RIGHT VENTRICLE            IVC RV S prime:     8.92 cm/s  IVC diam: 2.60 cm TAPSE (M-mode): 2.1 cm LEFT ATRIUM  Index        RIGHT ATRIUM           Index LA diam:        3.30 cm 2.12 cm/m   RA Area:     13.80 cm LA Vol (A2C):   28.3 ml 18.18 ml/m  RA Volume:   33.90 ml  21.78 ml/m LA Vol (A4C):   30.7 ml 19.72 ml/m LA Biplane Vol: 30.9 ml 19.85 ml/m  AORTIC VALVE LVOT Vmax:   132.00 cm/s LVOT Vmean:  90.400 cm/s LVOT VTI:    0.278 m  AORTA Ao Root diam: 3.40 cm Ao Asc diam:  3.75 cm MITRAL VALVE                TRICUSPID VALVE MV Area (PHT): 3.91 cm     TR Peak grad:   26.6 mmHg MV Decel Time: 194 msec     TR Vmax:        258.00 cm/s MV E velocity: 101.00 cm/s MV A velocity: 118.00 cm/s  SHUNTS MV E/A ratio:  0.86         Systemic VTI:  0.28 m                             Systemic Diam: 2.30 cm Glori Bickers MD Electronically signed by Glori Bickers MD Signature Date/Time: 07/03/2022/9:35:59 AM    Final    [place] EKG SITE RITE  Result Date: 07/02/2022 If Site Rite image not attached, placement could not be confirmed due to current cardiac rhythm.  CT Angio Chest PE W and/or Wo Contrast  Result Date: 07/01/2022 CLINICAL DATA:  Altered mental status, high clinical suspicion for PE EXAM: CT ANGIOGRAPHY CHEST WITH  CONTRAST TECHNIQUE: Multidetector CT imaging of the chest was performed using the standard protocol during bolus administration of intravenous contrast. Multiplanar CT image reconstructions and MIPs were obtained to evaluate the vascular anatomy. RADIATION DOSE REDUCTION: This exam was performed according to the departmental dose-optimization program which includes automated exposure control, adjustment of the mA and/or kV according to patient size and/or use of iterative reconstruction technique. CONTRAST:  80m OMNIPAQUE IOHEXOL 350 MG/ML SOLN COMPARISON:  Chest radiograph done earlier today FINDINGS: Cardiovascular: There are no intraluminal filling defects in central pulmonary artery branches. Evaluation of small peripheral branches is limited by motion artifacts and infiltrates. Ascending thoracic aorta measures 3.6 cm. Scattered coronary artery calcifications are seen. Heart is enlarged in size. Mediastinum/Nodes: There are slightly enlarged lymph nodes in both hilar regions, possibly suggesting reactive hyperplasia. Lungs/Pleura: Centrilobular and panlobular emphysema is seen. There is ectasia of bronchi. There are patchy infiltrates in right middle lobe and right lower lobe. Small linear patchy infiltrate is seen in posterior left lower lung fields. In image 116 of series 6, there is 11 mm pleural-based nodule in the lateral aspect of left lower lobe. There are small patchy ground-glass infiltrates in the upper lobes. Pleural thickening is seen in both apices, more so on the right side. Possible minimal right pleural effusion is seen. There are multiple blebs in the periphery of both upper lung fields, more so on the right side. There is no demonstrable pneumothorax. Upper Abdomen: No acute findings are seen. Musculoskeletal: No acute findings are seen. Review of the MIP images confirms the above findings. IMPRESSION: There is no evidence of central pulmonary artery embolism. There is no evidence of thoracic  aortic dissection. COPD. There are patchy alveolar infiltrates in  both lower lung fields, more so on the right side suggesting atelectasis/pneumonia. There are scattered ground-glass densities in both upper lung fields suggesting scarring or pneumonia. There is 11 mm noncalcified pleural-based nodule in left lower lobe. This may suggest part of pneumonia or malignant neoplasm. Short-term follow-up CT in 1-2 months along with PET-CT if warranted should be considered. Coronary artery calcifications are seen. Electronically Signed   By: Elmer Picker M.D.   On: 07/01/2022 14:41   CT Head Wo Contrast  Result Date: 07/01/2022 CLINICAL DATA:  Mental status change, unknown cause. EXAM: CT HEAD WITHOUT CONTRAST TECHNIQUE: Contiguous axial images were obtained from the base of the skull through the vertex without intravenous contrast. RADIATION DOSE REDUCTION: This exam was performed according to the departmental dose-optimization program which includes automated exposure control, adjustment of the mA and/or kV according to patient size and/or use of iterative reconstruction technique. COMPARISON:  None Available. FINDINGS: Brain: No acute hemorrhage, mass effect or midline shift. Gray-white differentiation is preserved. No hydrocephalus. No extra-axial collection. Basilar cisterns are patent. Vascular: No hyperdense vessel or unexpected calcification. Skull: No calvarial fracture or suspicious bone lesion. Skull base is unremarkable. Sinuses/Orbits: Unremarkable. Other: None. IMPRESSION: No acute intracranial abnormality. Electronically Signed   By: Emmit Alexanders M.D.   On: 07/01/2022 14:31   DG Chest Port 1 View  Result Date: 07/01/2022 CLINICAL DATA:  Short of breath, unresponsive, hypoxia EXAM: PORTABLE CHEST 1 VIEW COMPARISON:  03/14/2017 FINDINGS: Single frontal view of the chest demonstrates an unremarkable cardiac silhouette. Continued background emphysema and parenchymal lung scarring. Prominence of  the hilar vasculature likely reflects underlying pulmonary arterial hypertension. No airspace disease, effusion, or pneumothorax. No acute bony abnormalities. IMPRESSION: 1. Emphysema.  No acute airspace disease. 2. Prominent hilar vasculature, favor pulmonary arterial hypertension given evidence of background emphysema. Electronically Signed   By: Randa Ngo M.D.   On: 07/01/2022 09:46    Microbiology: Recent Results (from the past 240 hour(s))  Resp panel by RT-PCR (RSV, Flu A&B, Covid) Anterior Nasal Swab     Status: None   Collection Time: 07/01/22 12:34 PM   Specimen: Anterior Nasal Swab  Result Value Ref Range Status   SARS Coronavirus 2 by RT PCR NEGATIVE NEGATIVE Final    Comment: (NOTE) SARS-CoV-2 target nucleic acids are NOT DETECTED.  The SARS-CoV-2 RNA is generally detectable in upper respiratory specimens during the acute phase of infection. The lowest concentration of SARS-CoV-2 viral copies this assay can detect is 138 copies/mL. A negative result does not preclude SARS-Cov-2 infection and should not be used as the sole basis for treatment or other patient management decisions. A negative result may occur with  improper specimen collection/handling, submission of specimen other than nasopharyngeal swab, presence of viral mutation(s) within the areas targeted by this assay, and inadequate number of viral copies(<138 copies/mL). A negative result must be combined with clinical observations, patient history, and epidemiological information. The expected result is Negative.  Fact Sheet for Patients:  EntrepreneurPulse.com.au  Fact Sheet for Healthcare Providers:  IncredibleEmployment.be  This test is no t yet approved or cleared by the [place] FDA and  has been authorized for detection and/or diagnosis of SARS-CoV-2 by FDA under an Emergency Use Authorization (EUA). This EUA will remain  in effect (meaning this test can be  used) for the duration of the COVID-19 declaration under Section 564(b)(1) of the Act, 21 U.S.C.section 360bbb-3(b)(1), unless the authorization is terminated  or revoked sooner.  Influenza A by PCR NEGATIVE NEGATIVE Final   Influenza B by PCR NEGATIVE NEGATIVE Final    Comment: (NOTE) The Xpert Xpress SARS-CoV-2/FLU/RSV plus assay is intended as an aid in the diagnosis of influenza from Nasopharyngeal swab specimens and should not be used as a sole basis for treatment. Nasal washings and aspirates are unacceptable for Xpert Xpress SARS-CoV-2/FLU/RSV testing.  Fact Sheet for Patients: EntrepreneurPulse.com.au  Fact Sheet for Healthcare Providers: IncredibleEmployment.be  This test is not yet approved or cleared by the [place] FDA and has been authorized for detection and/or diagnosis of SARS-CoV-2 by FDA under an Emergency Use Authorization (EUA). This EUA will remain in effect (meaning this test can be used) for the duration of the COVID-19 declaration under Section 564(b)(1) of the Act, 21 U.S.C. section 360bbb-3(b)(1), unless the authorization is terminated or revoked.     Resp Syncytial Virus by PCR NEGATIVE NEGATIVE Final    Comment: (NOTE) Fact Sheet for Patients: EntrepreneurPulse.com.au  Fact Sheet for Healthcare Providers: IncredibleEmployment.be  This test is not yet approved or cleared by the [place] FDA and has been authorized for detection and/or diagnosis of SARS-CoV-2 by FDA under an Emergency Use Authorization (EUA). This EUA will remain in effect (meaning this test can be used) for the duration of the COVID-19 declaration under Section 564(b)(1) of the Act, 21 U.S.C. section 360bbb-3(b)(1), unless the authorization is terminated or revoked.  Performed at [facility], [place] [address], [place], [place] [address]   Respiratory (~20 pathogens) panel  by PCR     Status: None   Collection Time: 07/01/22 12:34 PM  Result Value Ref Range Status   Adenovirus NOT DETECTED NOT DETECTED Final   Coronavirus 229E NOT DETECTED NOT DETECTED Final    Comment: (NOTE) The Coronavirus on the Respiratory Panel, DOES NOT test for the novel  Coronavirus (2019 nCoV)    Coronavirus HKU1 NOT DETECTED NOT DETECTED Final   Coronavirus NL63 NOT DETECTED NOT DETECTED Final   Coronavirus OC43 NOT DETECTED NOT DETECTED Final   Metapneumovirus NOT DETECTED NOT DETECTED Final   Rhinovirus / Enterovirus NOT DETECTED NOT DETECTED Final   Influenza A NOT DETECTED NOT DETECTED Final   Influenza B NOT DETECTED NOT DETECTED Final   Parainfluenza Virus 1 NOT DETECTED NOT DETECTED Final   Parainfluenza Virus 2 NOT DETECTED NOT DETECTED Final   Parainfluenza Virus 3 NOT DETECTED NOT DETECTED Final   Parainfluenza Virus 4 NOT DETECTED NOT DETECTED Final   Respiratory Syncytial Virus NOT DETECTED NOT DETECTED Final   Bordetella pertussis NOT DETECTED NOT DETECTED Final   Bordetella Parapertussis NOT DETECTED NOT DETECTED Final   Chlamydophila pneumoniae NOT DETECTED NOT DETECTED Final   Mycoplasma pneumoniae NOT DETECTED NOT DETECTED Final    Comment: Performed at [facility], [place]. [address] [place], [place] [address]  Urine Culture (for pregnant, neutropenic or urologic patients or patients with an indwelling urinary catheter)     Status: None   Collection Time: 07/01/22  3:36 PM   Specimen: Urine, Clean Catch  Result Value Ref Range Status   Specimen Description   Final    URINE, CLEAN CATCH Performed at [facility], [place] [address] [place], [place] [address]    Special Requests   Final    NONE Performed at [facility], [place] [address], [place], [place] [address]    Culture   Final    NO GROWTH Performed at [facility], [address] [place], [place] [address]    Report Status 07/02/2022 FINAL  Final   Blood Culture (routine x 2)     Status: None   Collection Time: 07/01/22  4:10 PM   Specimen: BLOOD RIGHT ARM  Result Value Ref Range Status   Specimen Description   Final    BLOOD RIGHT ARM Performed at [place] [address] [place], [place] [address]    Special Requests   Final    BOTTLES DRAWN AEROBIC AND ANAEROBIC Blood Culture adequate volume Performed at [place] [address], [place], [place] [address]    Culture   Final    NO GROWTH 5 DAYS Performed at [facility], [place] [address] [place], [place] [address]    Report Status 07/06/2022 FINAL  Final  Blood Culture (routine x 2)     Status: None   Collection Time: 07/01/22  4:10 PM   Specimen: BLOOD LEFT ARM  Result Value Ref Range Status   Specimen Description   Final    BLOOD LEFT ARM Performed at [facility] [address], [place], [place] [address]    Special Requests   Final    BOTTLES DRAWN AEROBIC AND ANAEROBIC Blood Culture adequate volume Performed at [facility] [address], [place], [place] [address]    Culture   Final    NO GROWTH 5 DAYS Performed at [facility], [place] [address] [place], [place] [address]    Report Status 07/06/2022 FINAL  Final  MRSA Next Gen by PCR, Nasal     Status: None   Collection Time: 07/01/22  9:49 PM   Specimen: Nasal Mucosa; Nasal Swab  Result Value Ref Range Status   MRSA by PCR Next Gen NOT DETECTED NOT DETECTED Final    Comment: (NOTE) The GeneXpert MRSA Assay (FDA approved for NASAL specimens only), is one component of a comprehensive MRSA colonization surveillance program. It is not intended to diagnose MRSA infection nor to guide or monitor treatment for MRSA infections. Test performance is not FDA approved in patients less than 67 years old. Performed at [facility], [place] [address], [place], [place] [address]      Labs: Basic  Metabolic Panel: Recent Labs  Lab 07/05/22 0331 07/06/22 0321 07/07/22 0454 07/08/22 0407 07/09/22 0255  NA 140 134* 135 136 135  K 3.7 3.0* 3.8 3.2* 3.7  CL 102 96* 91* 87* 88*  CO2 27 32 36* 38* 36*  GLUCOSE 87 95 86 101* 114*  BUN 6* <5* '12 21 23  '$ CREATININE 0.49 0.43* 0.50 0.46 0.48  CALCIUM 8.5* 7.9* 8.4* 8.5* 8.7*  MG 1.5* 1.8 1.8 1.8 1.9  PHOS  --   --  1.8* 3.8  --    Liver Function Tests: Recent Labs  Lab 07/05/22 0331 07/06/22 0321 07/07/22 0454 07/08/22 0407  AST 25 43* 37 35  ALT '17 22 25 26  '$ ALKPHOS 65 67 66 78  BILITOT 0.4 0.4 0.4 0.4  PROT 5.8* 5.9* 5.8* 5.7*  ALBUMIN 2.7* 2.7* 2.8* 3.0*   No results for input(s): "LIPASE", "AMYLASE" in the last 168 hours. No results for input(s): "AMMONIA" in the last 168 hours. CBC: Recent Labs  Lab 07/05/22 0331 07/06/22 0321 07/07/22 0454 07/08/22 0407 07/09/22 0255  WBC 6.3 5.3 4.9 5.5 5.4  NEUTROABS 5.4 4.5 3.8 3.8 3.6  HGB 9.0* 8.8* 8.9* 9.0* 9.4*  HCT 27.9* 27.9*  28.0* 28.1* 29.0*  MCV 93.9 94.3 94.9 95.3 95.4  PLT 418* 388 371 346 369   Cardiac Enzymes: No results for input(s): "CKTOTAL", "CKMB", "CKMBINDEX", "TROPONINI" in the last 168 hours. BNP: BNP (last 3 results) Recent Labs    07/05/22 1100 07/07/22 0454  BNP 330.3* 150.0*    ProBNP (last 3 results) No results for input(s): "PROBNP" in the last 8760 hours.  CBG: Recent Labs  Lab 07/08/22 2056 07/09/22 0125 07/09/22 0649 07/09/22 0738 07/09/22 1212  GLUCAP 140* 105* 96 100* 105*       Signed:  Irine Seal MD.  [facility] 07/09/2022, 3:16 PM

## 2022-07-09 NOTE — Progress Notes (Signed)
Patient instructed on follow-up appointments that need to be scheduled with PCP and pulmonologist outpatient. All questions answered regarding new prescriptions and pharmacy verified with patient. Taxi confirmed to pick up patient at main entrance.  Patient safely transported off unit via wheelchair to main entrance.

## 2022-07-09 NOTE — Progress Notes (Addendum)
PICC removed per order.  Verbalizes understanding of home care and when to call MD, holding pressure if bleeding occurs. Site WNL.  Aware to stay in the bed 30 minutes.  Teach back method

## 2022-07-09 NOTE — Plan of Care (Signed)
  Problem: Education: Goal: Knowledge of General Education information will improve Description: Including pain rating scale, medication(s)/side effects and non-pharmacologic comfort measures 07/09/2022 1520 by Carlynn Purl, RN Outcome: Adequate for Discharge 07/09/2022 1520 by Carlynn Purl, RN Outcome: Progressing   Problem: Health Behavior/Discharge Planning: Goal: Ability to manage health-related needs will improve 07/09/2022 1520 by Carlynn Purl, RN Outcome: Adequate for Discharge 07/09/2022 1520 by Carlynn Purl, RN Outcome: Progressing   Problem: Clinical Measurements: Goal: Ability to maintain clinical measurements within normal limits will improve 07/09/2022 1520 by Carlynn Purl, RN Outcome: Adequate for Discharge 07/09/2022 1520 by Carlynn Purl, RN Outcome: Progressing Goal: Will remain free from infection 07/09/2022 1520 by Carlynn Purl, RN Outcome: Adequate for Discharge 07/09/2022 1520 by Carlynn Purl, RN Outcome: Progressing Goal: Diagnostic test results will improve 07/09/2022 1520 by Carlynn Purl, RN Outcome: Adequate for Discharge 07/09/2022 1520 by Carlynn Purl, RN Outcome: Progressing Goal: Respiratory complications will improve 07/09/2022 1520 by Carlynn Purl, RN Outcome: Adequate for Discharge 07/09/2022 1520 by Carlynn Purl, RN Outcome: Progressing Goal: Cardiovascular complication will be avoided 07/09/2022 1520 by Carlynn Purl, RN Outcome: Adequate for Discharge 07/09/2022 1520 by Carlynn Purl, RN Outcome: Progressing   Problem: Activity: Goal: Risk for activity intolerance will decrease 07/09/2022 1520 by Carlynn Purl, RN Outcome: Adequate for Discharge 07/09/2022 1520 by Carlynn Purl, RN Outcome: Progressing   Problem: Nutrition: Goal: Adequate nutrition will be maintained 07/09/2022 1520 by Carlynn Purl, RN Outcome: Adequate for Discharge 07/09/2022 1520 by Carlynn Purl, RN Outcome: Progressing   Problem:  Coping: Goal: Level of anxiety will decrease 07/09/2022 1520 by Carlynn Purl, RN Outcome: Adequate for Discharge 07/09/2022 1520 by Carlynn Purl, RN Outcome: Progressing   Problem: Elimination: Goal: Will not experience complications related to bowel motility 07/09/2022 1520 by Carlynn Purl, RN Outcome: Adequate for Discharge 07/09/2022 1520 by Carlynn Purl, RN Outcome: Progressing Goal: Will not experience complications related to urinary retention 07/09/2022 1520 by Carlynn Purl, RN Outcome: Adequate for Discharge 07/09/2022 1520 by Carlynn Purl, RN Outcome: Progressing   Problem: Pain Managment: Goal: General experience of comfort will improve 07/09/2022 1520 by Carlynn Purl, RN Outcome: Adequate for Discharge 07/09/2022 1520 by Carlynn Purl, RN Outcome: Progressing   Problem: Safety: Goal: Ability to remain free from injury will improve 07/09/2022 1520 by Carlynn Purl, RN Outcome: Adequate for Discharge 07/09/2022 1520 by Carlynn Purl, RN Outcome: Progressing   Problem: Skin Integrity: Goal: Risk for impaired skin integrity will decrease 07/09/2022 1520 by Carlynn Purl, RN Outcome: Adequate for Discharge 07/09/2022 1520 by Carlynn Purl, RN Outcome: Progressing

## 2022-07-09 NOTE — TOC Progression Note (Addendum)
Transition of Care [facility]) - Progression Note    Patient Details  Name: Marie Sloan MRN: [id number] Date of Birth: 1951-01-05  [facility] CM/SW Contact  Henrietta Dine, RN Phone Number: 07/09/2022, 12:35 PM  Clinical Narrative:    Order received for HHPT; spoke w/ pt in room; she agrees to services; she does not have an agency preference; contacted Malachy Mood at [facility] and she says agency can provide services to pt; pt notified and verbalized understanding; agency contact information also placed in follow up provider section of d/c summary.  1410- pt needs home oxygen; pt agrees to home oxygen and she does not have an agency preference; spoke w/ Erasmo Downer at [employer] and she says agency can provide service to pt; gave pt agency name, and explained travel tank will be delivered to room, and agency contact information placed in f/u provider section of d/c instructions; she verbalized understanding;  pt also says she does not have transportation home; will pass on to oncoming TOC.    Barriers to Discharge: Continued Medical Work up  Expected Discharge Plan and Services In-house Referral: NA Discharge Planning Services: CM Consult Post Acute Care Choice: NA Living arrangements for the past 2 months: Single Family Home                   DME Agency: NA                   Social Determinants of Health (SDOH) Interventions SDOH Screenings   Food Insecurity: No Food Insecurity (07/02/2022)  Housing: Low Risk  (07/02/2022)  Transportation Needs: No Transportation Needs (07/02/2022)  Utilities: Not At Risk (07/02/2022)  Tobacco Use: High Risk (07/01/2022)    Readmission Risk Interventions     No data to display

## 2022-07-09 NOTE — Progress Notes (Signed)
Physical Therapy Treatment Patient Details Name: Marie Sloan MRN: [id number] DOB: [birth year] Today's Date: 07/09/2022   History of Present Illness Patient is a 72 year old female with past medical history of alcohol use, nicotine dependence, chronic pain syndrome and recent diagnosis of pelvic mass.Patient presenting to [facility] emergency department 07/01/22 via [place] airport after being identified to be minimally responsive and lethargic by flight attendants. Patient found to have bilateral lower lobe infiltrates concerning for pneumonia, with clinical findings concerning for concurrent sepsis.    PT Comments    Walk saturation follow up test performed and documented. Patient will require supplemental oxygen as noted by RN  during earlier test. Patient eager to return home.      Recommendations for follow up therapy are one component of a multi-disciplinary discharge planning process, led by the attending physician.  Recommendations may be updated based on patient status, additional functional criteria and insurance authorization.  Follow Up Recommendations  Home health PT     Assistance Recommended at Discharge PRN  Patient can return home with the following Assist for transportation;Help with stairs or ramp for entrance;Assistance with cooking/housework   Equipment Recommendations  None recommended by PT    Recommendations for Other Services       Precautions / Restrictions Precautions Precautions: Fall Precaution Comments: monitor Sats     Mobility  Bed Mobility Overal bed mobility: Independent                  Transfers Overall transfer level: Independent                      Ambulation/Gait Ambulation/Gait assistance: Independent Gait Distance (Feet): 90 Feet Assistive device: None Gait Pattern/deviations: Step-through pattern Gait velocity: decr     General Gait Details: slow cadence in room, held dynamap in  hall   Stairs             Wheelchair Mobility    Modified Rankin (Stroke Patients Only)       Balance Overall balance assessment: Mild deficits observed, not formally tested                                          Cognition Arousal/Alertness: Awake/alert Behavior During Therapy: WFL for tasks assessed/performed, Flat affect Overall Cognitive Status: Within Functional Limits for tasks assessed                                          Exercises      General Comments        Pertinent Vitals/Pain Pain Assessment Pain Assessment: No/denies pain    Home Living                          Prior Function            PT Goals (current goals can now be found in the care plan section) Progress towards PT goals: Progressing toward goals    Frequency    Min 3X/week      PT Plan Current plan remains appropriate    Co-evaluation              AM-PAC PT "6 Clicks" Mobility   Outcome Measure  Help needed turning from  your back to your side while in a flat bed without using bedrails?: None Help needed moving from lying on your back to sitting on the side of a flat bed without using bedrails?: None Help needed moving to and from a bed to a chair (including a wheelchair)?: None Help needed standing up from a chair using your arms (e.g., wheelchair or bedside chair)?: None Help needed to walk in hospital room?: None Help needed climbing 3-5 steps with a railing? : None 6 Click Score: 24    End of Session Equipment Utilized During Treatment: Oxygen Activity Tolerance: Patient tolerated treatment well Patient left: in bed;with call bell/phone within reach;with bed alarm set Nurse Communication: Mobility status PT Visit Diagnosis: Unsteadiness on feet (R26.81);Muscle weakness (generalized) (M62.81);Difficulty in walking, not elsewhere classified (R26.2)     Time: [id number] PT Time Calculation (min) (ACUTE ONLY):  25 min  Charges:  $Gait Training: 23-37 mins                     [place] Office [phone] Weekend [id number]    Claretha Cooper 07/09/2022, 2:43 PM

## 2022-07-09 NOTE — Progress Notes (Signed)
SATURATION QUALIFICATIONS: (This note is used to comply with regulatory documentation for home oxygen)  Patient Saturations on Room Air at Rest = 86%  Patient Saturations on Room Air while Ambulating = 83%  Patient Saturations on 3 Liters of oxygen while Ambulating = 92%  Please briefly explain why patient needs home oxygen: Patient's oxygen drops below a safe level while on room air

## 2022-07-09 NOTE — Progress Notes (Signed)
SATURATION QUALIFICATIONS: (This note is used to comply with regulatory documentation for home oxygen)  Patient Saturations on Room Air at Rest = 87%  Patient Saturations on Room Air while Ambulating = 85%  Patient Saturations on 2 Liters of oxygen while Ambulating = 90%  Please briefly explain why patient needs home oxygen:To maintain  oxygenation during functional activities such as ambulation. [place] Office [phone] Weekend [id number]

## 2022-07-10 ENCOUNTER — Encounter: Payer: Self-pay | Admitting: Emergency Medicine

## 2022-07-10 ENCOUNTER — Ambulatory Visit
Admission: EM | Admit: 2022-07-10 | Discharge: 2022-07-10 | Disposition: A | Discharge status: Home / Self Care | Payer: Medicare HMO | Attending: Family Medicine | Admitting: Family Medicine

## 2022-07-10 DIAGNOSIS — R0602 Shortness of breath: Secondary | ICD-10-CM | POA: Diagnosis not present

## 2022-07-10 DIAGNOSIS — R4182 Altered mental status, unspecified: Secondary | ICD-10-CM | POA: Diagnosis not present

## 2022-07-10 LAB — VITAMIN C: Vitamin C: 1.3 mg/dL (ref 0.4–2.0)

## 2022-07-10 NOTE — ED Triage Notes (Signed)
Pt here w/ altered mentation, low O2 sats -  Pt was sent home last night from the hospital Pt states she is supposed to be on O2 at home Pt states machines are not working  O2 sats on arrival ar 85% on room air Placed in w/c  [place] pt was wering was cut in half- new cannula placed on pt w/ O2 at 2 l FiO2 Pt denies any alcohol since discharge Pt drove self from home

## 2022-07-10 NOTE — Discharge Instructions (Addendum)
Patient transported to nearest ED ([place] [facility]) via EMS at 12:49 PM

## 2022-07-10 NOTE — ED Notes (Signed)
Patient is being discharged from the Urgent Care and sent to the Emergency Department via EMS. Per Eliezer Lofts FNP, patient is in need of higher level of care due to altered mental status and hypoxia. Patient is aware and verbalizes understanding of plan of care.  Vitals:   07/10/22 1249  BP: (!) 163/91  Pulse: (!) 108  Resp: (!) 28  Temp: 99.3 F (37.4 C)  SpO2: 94%

## 2022-07-10 NOTE — ED Provider Notes (Addendum)
[facility]    CSN: [id number] Arrival date & time: 07/10/22  1236      History   Chief Complaint Chief Complaint  Patient presents with   Altered Mental Status    HPI Marie Sloan is a 72 y.o. female.   HPI Pleasant 72 year old female presents to clinic today short of breath (pulse ox 85% at check-in) patient was recently admitted to [facility] via [place] long ED on 07/01/2022 for community-acquired pneumonia, sepsis, acute encephalopathy, metabolic acidosis and pulmonary nodule.  Patient was initially found unresponsive on airplane with pulse ox of 86% patient was then transported to [facility] long ED. Patient was admitted to [facility] on 07/01/22 and discharged yesterday, 07/09/2022 home in taxi without oxygen or oxygen available at her home.  PMH significant for acute on chronic CHF, acute respiratory failure with hypoxia and hypercapnia, septic shock, and COPD.  Patient endorses drinking alcohol after discharge and may have this morning.  Patient drove herself to our clinic today.  She reports that she has no oxygen at home and oxygen compressors are not working properly.  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      Home Medications    Prior to Admission medications   Medication Sig Start Date End Date Taking? Authorizing Provider  amLODipine (NORVASC) 5 MG tablet Take 5 mg by mouth daily.    [provider]  BIOTIN PO Take 1 tablet by mouth daily.    [provider]  feeding supplement (ENSURE ENLIVE / ENSURE PLUS) LIQD Take 237 mLs by mouth 3 (three) times daily between meals. 07/09/22   Eugenie Filler, MD  folic acid (FOLVITE) 1 MG tablet Take 1 tablet (1 mg total) by mouth daily. 07/10/22   Eugenie Filler, MD  Multiple Vitamin (MULTIVITAMIN WITH MINERALS) TABS tablet Take 1 tablet by mouth daily. 07/10/22   Eugenie Filler, MD  nicotine (NICODERM CQ - DOSED IN MG/24 HOURS) 14 mg/24hr patch Place 1 patch (14 mg total) onto the skin daily at 6 (six) AM. 07/10/22   Eugenie Filler, MD  nutrition supplement, JUVEN, (JUVEN) PACK Take 1 packet by mouth 2 (two) times daily between meals. 07/10/22   Eugenie Filler, MD  OVER THE COUNTER MEDICATION vitamins    [provider]  thiamine (VITAMIN B-1) 100 MG tablet Take 1 tablet (100 mg total) by mouth daily. 07/10/22   Eugenie Filler, MD  umeclidinium-vilanterol [facility] ELLIPTA) 62.5-25 MCG/ACT AEPB Inhale 1 puff into the lungs daily. 07/09/22   Eugenie Filler, MD    Family History Family  History  Family history unknown: Yes    Social History Social History   Tobacco Use   Smoking status: Every Day    Packs/day: 1.00    Years: 44.00    Additional pack years: 0.00    Total pack years: 44.00    Types: Cigarettes   Smokeless tobacco: Never  Substance Use Topics   Alcohol use: Yes    Comment: 12 drinks a week     Allergies   Hydrocodone and Penicillins   Review of Systems Review of Systems  Respiratory:  Positive for shortness of breath.    Neurological:  Positive for weakness.  All other systems reviewed and are negative.    Physical Exam Triage Vital Signs ED Triage Vitals  Enc Vitals Group     BP      Pulse      Resp      Temp      Temp src      SpO2      Weight      Height      Head Circumference      Peak Flow      Pain Score      Pain Loc      Pain Edu?      Excl. in [place]?    No data found.  Updated Vital Signs BP (!) 163/91 (BP Location: Right Arm)   Pulse (!) 108   Temp 99.3 F (37.4 C) (Oral)   Resp (!) 28   SpO2 94%    Physical Exam Vitals and nursing note reviewed.  Constitutional:      General: She is not in acute distress.    Appearance: Normal appearance. She is normal weight. She is ill-appearing.  HENT:     Head: Normocephalic and atraumatic.     Right Ear: Tympanic membrane, ear canal and external ear normal.     Left Ear: Tympanic membrane, ear canal and external ear normal.     Mouth/Throat:     Mouth: Mucous membranes are moist.     Pharynx: Oropharynx is clear.  Eyes:     Extraocular Movements: Extraocular movements intact.     Conjunctiva/sclera: Conjunctivae normal.     Pupils: Pupils are equal, round, and reactive to light.  Cardiovascular:     Rate and Rhythm: Regular rhythm. Tachycardia present.     Pulses: Normal pulses.     Heart sounds: Normal heart sounds.  Pulmonary:     Effort: Pulmonary effort is normal.     Breath sounds: Rhonchi present. No wheezing or rales.     Comments: Diminished breath sounds noted throughout Musculoskeletal:        General: Normal range of motion.     Cervical back: Normal range of motion and neck supple.  Skin:    General: Skin is warm and dry.  Neurological:     General: No focal deficit present.     Mental Status: She is alert. Mental status is at baseline. She is disoriented.     Motor: Weakness present.     Comments: Patient seated comfortably in wheelchair during exam today  Psychiatric:        Mood and Affect: Mood  normal.        Behavior: Behavior normal.        Thought Content: Thought content normal.      UC Treatments / Results  Labs (all labs ordered are listed, but only abnormal results are displayed) Labs Reviewed - No data  to display  EKG   Radiology No results found.  Procedures Procedures (including critical care time)  Medications Ordered in UC Medications - No data to display  Initial Impression / Assessment and Plan / UC Course  I have reviewed the triage vital signs and the nursing notes.  Pertinent labs & imaging results that were available during my care of the patient were reviewed by me and considered in my medical decision making (see chart for details).     MDM: 1.  Shortness of breath-SpO2 94% on 2 L via nasal cannula as EMS arrives at 12:55 PM today; 2.  Altered mental status, unspecified altered mental status type-initially during triage patient exhibited confusion oriented x 2, during exam oriented x 2 (suspected that patient was drinking alcohol last night after discharge and may have had alcohol this morning) patient transported by of EMS to [place] ED at 1:08 PM today. Final Clinical Impressions(s) / UC Diagnoses   Final diagnoses:  Altered mental status, unspecified altered mental status type  Shortness of breath     Discharge Instructions      Patient transported to nearest ED ([facility] [facility]) via EMS at 12:49 PM     ED Prescriptions   None    PDMP not reviewed this encounter.   Eliezer Lofts, [place] 07/10/22 [place], [place], [place] 07/10/22 1441

## 2022-07-10 NOTE — ED Notes (Signed)
Late entry at 22- Adult Protective Services report filed by this RN - pt is not safe to be discharged to home due altered mentation and lack of O2 at home. Pt arrived to [facility] with [place] cut in half. Pt stated she has O2 at home but it is not working. Patient lives alone and is a poor historian, history of COPD, sepsis & ETOH abuse. Pt left debit card at [facility], placed in safe. Voice  mail left by pt access to let her know the debit card was in the safe. This RN also notified [facility] charge RN on  update on the report filed w/ [facility] [employer] so a safe discharge can be planned for [place].

## 2022-07-11 ENCOUNTER — Telehealth: Payer: Self-pay | Admitting: Emergency Medicine

## 2022-07-11 LAB — VITAMIN A: Vitamin A (Retinoic Acid): 17.7 ug/dL — ABNORMAL LOW (ref 22.0–69.5)

## 2022-07-11 NOTE — Telephone Encounter (Signed)
Call to [facility] to let her know her debit card was in the safe at [facility] & her Lucianne Lei is still here. Windows were cracked open, so if someone could come by to get the keys, the windows could be closed. Call back # left for questions or concerns. Pt is currently admitted at [facility]

## 2022-07-11 NOTE — Telephone Encounter (Signed)
Sports coach at [employer] to pick up [employer], debit card & keys from visit to [facility]  on 07/10/22. This RN was Marie Sloan's nurse yesterday. The only personal belonging patient had with her yesterday was her debit card. Pt did not have a purse on arrival to [facility]. No keys were left at [facility] by patient. At this time, Marie Sloan's debit card remains in the [facility] safe at this time. [facility] Engineer, structural did not want to take a debit card that was not in a wallet. Marie Sloan's Lucianne Lei remains in the [facility] parking lot. Police states it is unlocked and he was not able to locate a purse or keys inside of it.
